# Patient Record
Sex: Male | Born: 1945 | Hispanic: Yes | Marital: Married | Smoking: Former smoker
Health system: Southern US, Community
[De-identification: ages and names within clinical notes are randomized; demographics above are authoritative.]

## PROBLEM LIST (undated history)

## (undated) DIAGNOSIS — B019 Varicella without complication: Secondary | ICD-10-CM

## (undated) DIAGNOSIS — I639 Cerebral infarction, unspecified: Secondary | ICD-10-CM

## (undated) DIAGNOSIS — I10 Essential (primary) hypertension: Secondary | ICD-10-CM

## (undated) DIAGNOSIS — J45909 Unspecified asthma, uncomplicated: Secondary | ICD-10-CM

## (undated) DIAGNOSIS — N189 Chronic kidney disease, unspecified: Secondary | ICD-10-CM

## (undated) DIAGNOSIS — T7840XA Allergy, unspecified, initial encounter: Secondary | ICD-10-CM

## (undated) HISTORY — DX: Cerebral infarction, unspecified: I63.9

## (undated) HISTORY — DX: Varicella without complication: B01.9

## (undated) HISTORY — DX: Allergy, unspecified, initial encounter: T78.40XA

## (undated) HISTORY — PX: CHOLECYSTECTOMY: SHX55

## (undated) HISTORY — DX: Chronic kidney disease, unspecified: N18.9

## (undated) HISTORY — DX: Unspecified asthma, uncomplicated: J45.909

## (undated) HISTORY — DX: Essential (primary) hypertension: I10

---

## 2010-12-06 ENCOUNTER — Encounter: Payer: Self-pay | Admitting: Family Medicine

## 2010-12-06 ENCOUNTER — Ambulatory Visit (INDEPENDENT_AMBULATORY_CARE_PROVIDER_SITE_OTHER): Payer: Medicare HMO | Admitting: Family Medicine

## 2010-12-06 VITALS — BP 130/70 | HR 72 | Temp 98.2°F | Resp 12 | Ht 68.75 in | Wt 176.0 lb

## 2010-12-06 DIAGNOSIS — J309 Allergic rhinitis, unspecified: Secondary | ICD-10-CM

## 2010-12-06 DIAGNOSIS — R05 Cough: Secondary | ICD-10-CM

## 2010-12-06 DIAGNOSIS — I10 Essential (primary) hypertension: Secondary | ICD-10-CM

## 2010-12-06 DIAGNOSIS — N200 Calculus of kidney: Secondary | ICD-10-CM

## 2010-12-06 MED ORDER — MONTELUKAST SODIUM 10 MG PO TABS: 10.0000 mg | ORAL_TABLET | Freq: Every day | ORAL | Status: DC

## 2010-12-06 MED ORDER — VALSARTAN-HYDROCHLOROTHIAZIDE 160-25 MG PO TABS: 1.0000 | ORAL_TABLET | Freq: Every day | ORAL | Status: DC

## 2010-12-06 NOTE — Patient Instructions (Signed)
Schedule complete physical yearly.

## 2010-12-06 NOTE — Progress Notes (Signed)
  Subjective:    Patient ID: Dustin Nielsen, male    DOB: 01-27-1946, 65 y.o.   MRN: [id number]  HPI Patient is seen new to establish care. Past medical history reviewed. Recently moved here from [place]. His children live here. He is married. Past medical history significant for hypertension, history of kidney stones, and seasonal and perennial allergies. Medications reviewed. Takes Diovan HCTZ for hypertension and needs refills. Blood pressure is well controlled. Walks some for exercise. Weight stable. Denies any headaches, dizziness, chest pain.  Frequent nasal congestion symptoms. Tried Claritin and Zyrtec without much relief. He has nighttime cough and wheezing. No history of asthma. Nonsmoker. He denies any GERD symptoms.  The patient is married. Smoked only briefly in his 54s and early 32s. No alcohol use. He is retired.  Family history is that of father having stroke and hypertension. Mother died age 21 of some type of kidney disease.   Review of Systems  Constitutional: Negative for fever, chills, appetite change, fatigue and unexpected weight change.  HENT: Positive for congestion, rhinorrhea and postnasal drip. Negative for neck pain.   Respiratory: Positive for cough and wheezing. Negative for shortness of breath.   Cardiovascular: Negative for chest pain, palpitations and leg swelling.  Gastrointestinal: Negative for abdominal pain.  Genitourinary: Negative for dysuria.  Musculoskeletal: Negative for back pain.  Neurological: Negative for dizziness, syncope and headaches.  Hematological: Negative for adenopathy.  Psychiatric/Behavioral: Negative for dysphoric mood.       Objective:   Physical Exam  Constitutional: He is oriented to person, place, and time. He appears well-developed and well-nourished. No distress.  HENT:  Head: Normocephalic and atraumatic.  Right Ear: External ear normal.  Left Ear: External ear normal.  Mouth/Throat: Oropharynx is clear and moist. No  oropharyngeal exudate.  Neck: Neck supple. No thyromegaly present.  Cardiovascular: Normal rate, regular rhythm and normal heart sounds.   Pulmonary/Chest: Effort normal and breath sounds normal. No respiratory distress. He has no wheezes. He has no rales.  Musculoskeletal: He exhibits no edema.  Lymphadenopathy:    He has no cervical adenopathy.  Neurological: He is alert and oriented to person, place, and time.  Psychiatric: He has a normal mood and affect.          Assessment & Plan:  #1 hypertension stable refill medication for one year #2 history of kidney stones. No recent symptoms #3 history of seasonal/ perennial allergies.  Associated nighttime cough which may be allergy related. Question mild component of reactive airway disease though no wheezing noted this time.   Start Singulair 10 mg daily and touch base within one month if no better #4 health maintenance issues addressed. Colonoscopy 2011. [facility] records. Schedule complete physical later this year

## 2010-12-07 DIAGNOSIS — N200 Calculus of kidney: Secondary | ICD-10-CM | POA: Insufficient documentation

## 2010-12-07 DIAGNOSIS — J309 Allergic rhinitis, unspecified: Secondary | ICD-10-CM | POA: Insufficient documentation

## 2010-12-18 ENCOUNTER — Ambulatory Visit (INDEPENDENT_AMBULATORY_CARE_PROVIDER_SITE_OTHER): Payer: Medicare HMO | Admitting: Family Medicine

## 2010-12-18 DIAGNOSIS — Z23 Encounter for immunization: Secondary | ICD-10-CM

## 2011-07-01 ENCOUNTER — Telehealth: Payer: Self-pay | Admitting: Family Medicine

## 2011-07-01 NOTE — Telephone Encounter (Signed)
Pt has appt 07/02/11 at 2:45pm.

## 2011-07-01 NOTE — Telephone Encounter (Signed)
Pt is experiencing symptoms of a cold and is having a bad cough and would like to be seen by Dr. Caryl Never. Please contact

## 2011-07-02 ENCOUNTER — Ambulatory Visit (INDEPENDENT_AMBULATORY_CARE_PROVIDER_SITE_OTHER): Payer: Medicare HMO | Admitting: Family Medicine

## 2011-07-02 ENCOUNTER — Encounter: Payer: Self-pay | Admitting: Family Medicine

## 2011-07-02 VITALS — BP 144/78 | Temp 98.0°F | Wt 179.0 lb

## 2011-07-02 DIAGNOSIS — J45909 Unspecified asthma, uncomplicated: Secondary | ICD-10-CM

## 2011-07-02 MED ORDER — AZITHROMYCIN 250 MG PO TABS: ORAL_TABLET | ORAL | Status: AC

## 2011-07-02 MED ORDER — METHYLPREDNISOLONE ACETATE 80 MG/ML IJ SUSP
80.0000 mg | Freq: Once | INTRAMUSCULAR | Status: AC
  Administered 2011-07-02: 80 mg via INTRAMUSCULAR

## 2011-07-02 NOTE — Patient Instructions (Signed)

## 2011-07-02 NOTE — Progress Notes (Signed)
  Subjective:    Patient ID: Dustin Nielsen, male    DOB: January 08, 1946, 65 y.o.   MRN: [id number]  HPI  Acute visit. Patient is seen with 2 history of cough. He had some intermittent wheezing especially at night. No fevers or chills. Quit smoking almost 35 years ago. No history of asthma. Cough productive of yellow sputum. Some ongoing nasal congestion. No release with nasal irrigation. Denies any appetite or weight changes. No hemoptysis. No pleuritic pain.   Review of Systems  Constitutional: Negative for fever, chills and fatigue.  HENT: Negative for ear pain and sore throat.   Respiratory: Positive for cough, shortness of breath (at night only) and wheezing.   Cardiovascular: Negative for chest pain.       Objective:   Physical Exam  Constitutional: He appears well-developed and well-nourished.  HENT:  Right Ear: External ear normal.  Left Ear: External ear normal.  Mouth/Throat: Oropharynx is clear and moist.  Neck: Neck supple.  Cardiovascular: Normal rate and regular rhythm.   Pulmonary/Chest:       Patient some diffuse expiratory wheezes. No rales.          Assessment & Plan:  Asthmatic bronchitis. Zithromax for 5 days. Depo-Medrol 80 mg IM.  Touch base one week if no improvement and sooner as needed

## 2011-07-28 ENCOUNTER — Telehealth: Payer: Self-pay | Admitting: Family Medicine

## 2011-07-28 ENCOUNTER — Encounter: Payer: Self-pay | Admitting: Family

## 2011-07-28 ENCOUNTER — Ambulatory Visit (INDEPENDENT_AMBULATORY_CARE_PROVIDER_SITE_OTHER): Payer: Medicare HMO | Admitting: Family

## 2011-07-28 VITALS — BP 138/90 | Temp 98.1°F | Wt 174.0 lb

## 2011-07-28 DIAGNOSIS — R05 Cough: Secondary | ICD-10-CM

## 2011-07-28 DIAGNOSIS — J209 Acute bronchitis, unspecified: Secondary | ICD-10-CM

## 2011-07-28 MED ORDER — ALBUTEROL SULFATE HFA 108 (90 BASE) MCG/ACT IN AERS: 2.0000 | INHALATION_SPRAY | Freq: Four times a day (QID) | RESPIRATORY_TRACT | Status: DC | PRN

## 2011-07-28 MED ORDER — PREDNISONE 20 MG PO TABS: ORAL_TABLET | ORAL | Status: AC

## 2011-07-28 MED ORDER — ALBUTEROL SULFATE (2.5 MG/3ML) 0.083% IN NEBU: 2.5000 mg | INHALATION_SOLUTION | Freq: Four times a day (QID) | RESPIRATORY_TRACT | Status: DC | PRN

## 2011-07-28 NOTE — Telephone Encounter (Signed)
Returned pt call. According to OV note, nebulizer solution was ordered instead of inhaler.  Rx for inhaler sent to pt's pharmacy and pt aware.

## 2011-07-28 NOTE — Progress Notes (Signed)
Subjective:    Patient ID: Dustin Nielsen, male    DOB: 1945-10-10, 66 y.o.   MRN: [id number]  HPI 66 year old male, former smoker, a patient of Dr. Caryl Never is in today with complaint of cough and wheezing and one on since December 12. He was prescribed a Z-Pak at that time, Depo-Medrol IM in the office, and Singulair. He has completed that therapy and his symptoms have not resolved. Denies any fever, myalgias, or chest pain.   Review of Systems  Constitutional: Negative.   HENT: Negative.   Eyes: Negative.   Respiratory: Positive for cough and wheezing.   Cardiovascular: Negative.   Musculoskeletal: Negative.   Skin: Negative.   Neurological: Negative.   Hematological: Negative.   Psychiatric/Behavioral: Negative.    Past Medical History  Diagnosis Date  . Chicken pox   . Allergy   . Hypertension   . Chronic kidney disease     stones    History   Social History  . Marital Status: Married    Spouse Name: N/A    Number of Children: N/A  . Years of Education: N/A   Occupational History  . Not on file.   Social History Main Topics  . Smoking status: Former Smoker -- 1.0 packs/day for 15 years    Types: Cigarettes    Quit date: 12/05/1973  . Smokeless tobacco: Not on file  . Alcohol Use: Not on file  . Drug Use: Not on file  . Sexually Active: Not on file   Other Topics Concern  . Not on file   Social History Narrative  . No narrative on file    Past Surgical History  Procedure Date  . Cholecystectomy     Family History  Problem Relation Age of Onset  . Heart disease Mother   . Kidney disease Mother 70    deceased  . Stroke Father   . Hypertension Father     No Known Allergies  Current Outpatient Prescriptions on File Prior to Visit  Medication Sig Dispense Refill  . Ascorbic Acid (VITAMIN C) 1000 MG tablet Take 1,000 mg by mouth daily.        . montelukast (SINGULAIR) 10 MG tablet Take 1 tablet (10 mg total) by mouth at bedtime.  30 tablet  11   . Multiple Vitamin (MULTI VITAMIN MENS PO) Take by mouth daily.        . valsartan-hydrochlorothiazide (DIOVAN-HCT) 160-25 MG per tablet Take 1 tablet by mouth daily.  90 tablet  3    BP 138/90  Temp(Src) 98.1 F (36.7 C) (Oral)  Wt 174 lb (78.926 kg)chart   Objective:   Physical Exam  Constitutional: He is oriented to person, place, and time. He appears well-developed and well-nourished.  HENT:  Right Ear: External ear normal.  Left Ear: External ear normal.  Nose: Nose normal.  Mouth/Throat: Oropharynx is clear and moist.  Eyes: Conjunctivae are normal.  Neck: Normal range of motion. Neck supple.  Cardiovascular: Normal rate, regular rhythm and normal heart sounds.   Pulmonary/Chest: Effort normal. No respiratory distress. He has wheezes. He has no rales.       Wheezing noted bilaterally. Good air movement. No respiratory distress.  Musculoskeletal: Normal range of motion.  Neurological: He is alert and oriented to person, place, and time.  Skin: Skin is warm and dry.  Psychiatric: He has a normal mood and affect.          Assessment & Plan:  Assessment: Bronchitis, cough  Plan: Prednisone  60x3, 40x3, 20x3. Proventil HFA 2 puffs every 6 hours when necessary. Nebulizer treatment given in the office in. Patient to call if symptoms worsen or persist, recheck as scheduled and when necessary.

## 2011-07-28 NOTE — Telephone Encounter (Signed)
Was seen today and has a question about a rx was called in today. Thanks.

## 2011-07-28 NOTE — Telephone Encounter (Signed)
Pt called again. Thanks!

## 2011-07-28 NOTE — Patient Instructions (Signed)

## 2011-09-04 ENCOUNTER — Ambulatory Visit (INDEPENDENT_AMBULATORY_CARE_PROVIDER_SITE_OTHER): Payer: Medicare HMO | Admitting: Family Medicine

## 2011-09-04 ENCOUNTER — Encounter: Payer: Self-pay | Admitting: Family Medicine

## 2011-09-04 DIAGNOSIS — J45909 Unspecified asthma, uncomplicated: Secondary | ICD-10-CM

## 2011-09-04 NOTE — Progress Notes (Signed)
  Subjective:    Patient ID: Dustin Nielsen, male    DOB: [birth year], 66 y.o.   MRN: [id number]  HPI  Patient is seen with persistent cough and wheezing now for a few months. He has questionable history of asthma has been seen twice since December with cough and wheeze. Each time he was treated with prednisone he had some brief mild improvement. He has been taking Singulair without relief. He gets temporary relief with Ventolin for about 6 hours. He does not recall being on anti-inflammatory medications previously. He quit smoking back in 1975. Possible triggers for wheezing include cold air exposure. Symptoms seem worse at night and have been almost daily. He has not had any appetite change or weight change. No fevers or chills. No hemoptysis. No chronic sinusitis symptoms  Past Medical History  Diagnosis Date  . Chicken pox   . Allergy   . Hypertension   . Chronic kidney disease     stones   Past Surgical History  Procedure Date  . Cholecystectomy     reports that he quit smoking about 37 years ago. His smoking use included Cigarettes. He has a 15 pack-year smoking history. He does not have any smokeless tobacco history on file. His alcohol and drug histories not on file. family history includes Heart disease in his mother; Hypertension in his father; Kidney disease (age of onset:43) in his mother; and Stroke in his father. No Known Allergies    Review of Systems  Constitutional: Negative for fever, chills, appetite change and unexpected weight change.  HENT: Negative for congestion, voice change and postnasal drip.   Respiratory: Positive for cough, shortness of breath and wheezing.   Cardiovascular: Negative for chest pain, palpitations and leg swelling.  Neurological: Negative for dizziness and syncope.  Hematological: Negative for adenopathy.       Objective:   Physical Exam  Constitutional: He appears well-developed and well-nourished.  HENT:  Mouth/Throat: Oropharynx is  clear and moist.  Neck: Neck supple. No thyromegaly present.  Cardiovascular: Normal rate and regular rhythm.   Pulmonary/Chest: Effort normal.       Patient has some diffuse expiratory wheezes. No rales.  Lymphadenopathy:    He has no cervical adenopathy.          Assessment & Plan:  Persistent cough/wheezing. Suspect moderate persistent asthma. Check spirometry. Consider anti-inflammatory medication such as Advair.  Will start Advair 250 mg one puff Bid.  He has almost daily cough and wheeze.  Samples given and reassess one month.

## 2011-09-04 NOTE — Patient Instructions (Signed)
Start Advair 1 puff twice daily. Rinse mouth after use. Stop Singulair

## 2011-10-02 ENCOUNTER — Encounter: Payer: Self-pay | Admitting: Family Medicine

## 2011-10-02 ENCOUNTER — Ambulatory Visit (INDEPENDENT_AMBULATORY_CARE_PROVIDER_SITE_OTHER): Payer: Medicare HMO | Admitting: Family Medicine

## 2011-10-02 DIAGNOSIS — J45909 Unspecified asthma, uncomplicated: Secondary | ICD-10-CM

## 2011-10-02 MED ORDER — FLUTICASONE-SALMETEROL 100-50 MCG/DOSE IN AEPB: 1.0000 | INHALATION_SPRAY | Freq: Two times a day (BID) | RESPIRATORY_TRACT | Status: DC

## 2011-10-02 NOTE — Progress Notes (Signed)
  Subjective:    Patient ID: Dustin Nielsen, male    DOB: 1946-02-09, 66 y.o.   MRN: [id number]  HPI  Patient seen for followup asthma. He had several months of cough and wheezing especially at night. We started Advair and he has seen tremendous improvement. His cough is essentially resolved. Increased exercise capacity. No wheezing after 2 weeks of use of Advair. Occasional hoarseness. No thrush. Denies any dyspnea. No side effects to medication.   Review of Systems  Constitutional: Negative for fever, chills and appetite change.  Respiratory: Negative for cough, shortness of breath and wheezing.   Cardiovascular: Negative for chest pain.  Neurological: Negative for dizziness.  Hematological: Negative for adenopathy.       Objective:   Physical Exam  Constitutional: He appears well-developed and well-nourished.  HENT:  Mouth/Throat: Oropharynx is clear and moist.  Cardiovascular: Normal rate and regular rhythm.   No murmur heard. Pulmonary/Chest: Effort normal and breath sounds normal. No respiratory distress. He has no wheezes. He has no rales.  Musculoskeletal: He exhibits no edema.          Assessment & Plan:  Asthma greatly improved. Continue Advair with prescription for 100/50 mg one puff twice daily and remind her to rinse after use. Routine followup 6 months. Flu vaccine then

## 2011-10-02 NOTE — Patient Instructions (Signed)

## 2011-12-02 ENCOUNTER — Other Ambulatory Visit: Payer: Self-pay | Admitting: Family Medicine

## 2011-12-30 ENCOUNTER — Other Ambulatory Visit: Payer: Self-pay | Admitting: *Deleted

## 2011-12-30 DIAGNOSIS — Z0000 Encounter for general adult medical examination without abnormal findings: Secondary | ICD-10-CM

## 2011-12-31 ENCOUNTER — Other Ambulatory Visit (INDEPENDENT_AMBULATORY_CARE_PROVIDER_SITE_OTHER): Payer: Medicare HMO

## 2011-12-31 DIAGNOSIS — I10 Essential (primary) hypertension: Secondary | ICD-10-CM

## 2011-12-31 DIAGNOSIS — Z125 Encounter for screening for malignant neoplasm of prostate: Secondary | ICD-10-CM

## 2011-12-31 DIAGNOSIS — Z0000 Encounter for general adult medical examination without abnormal findings: Secondary | ICD-10-CM

## 2011-12-31 LAB — HEPATIC FUNCTION PANEL
ALT: 29 U/L (ref 0–53)
AST: 23 U/L (ref 0–37)
Alkaline Phosphatase: 64 U/L (ref 39–117)
Bilirubin, Direct: 0.1 mg/dL (ref 0.0–0.3)
Total Protein: 6.7 g/dL (ref 6.0–8.3)

## 2011-12-31 LAB — POCT URINALYSIS DIPSTICK
Nitrite, UA: NEGATIVE
Protein, UA: NEGATIVE
Spec Grav, UA: 1.015
Urobilinogen, UA: 0.2

## 2011-12-31 LAB — BASIC METABOLIC PANEL
Calcium: 9.5 mg/dL (ref 8.4–10.5)
Creatinine, Ser: 1.1 mg/dL (ref 0.4–1.5)
GFR: 73.77 mL/min (ref >60.00)
Sodium: 141 mEq/L (ref 135–145)

## 2011-12-31 LAB — CBC WITH DIFFERENTIAL/PLATELET
Basophils Relative: 1 % (ref 0.0–3.0)
Eosinophils Absolute: 0.3 10*3/uL (ref 0.0–0.7)
Eosinophils Relative: 6.3 % — ABNORMAL HIGH (ref 0.0–5.0)
Hemoglobin: 15 g/dL (ref 13.0–17.0)
Lymphocytes Relative: 39.8 % (ref 12.0–46.0)
Monocytes Relative: 6.3 % (ref 3.0–12.0)
Neutro Abs: 2.4 10*3/uL (ref 1.4–7.7)
Neutrophils Relative %: 46.6 % (ref 43.0–77.0)
RBC: 4.76 Mil/uL (ref 4.22–5.81)
WBC: 5.1 10*3/uL (ref 4.5–10.5)

## 2011-12-31 LAB — PSA: PSA: 1.43 ng/mL (ref 0.10–4.00)

## 2011-12-31 LAB — LIPID PANEL
LDL Cholesterol: 108 mg/dL — ABNORMAL HIGH (ref 0–99)
Total CHOL/HDL Ratio: 4
Triglycerides: 142 mg/dL (ref 0.0–149.0)

## 2012-01-05 ENCOUNTER — Encounter: Payer: Medicare HMO | Admitting: Family Medicine

## 2012-01-23 ENCOUNTER — Encounter: Payer: Medicare HMO | Admitting: Family Medicine

## 2012-02-05 ENCOUNTER — Encounter: Payer: Self-pay | Admitting: Family Medicine

## 2012-02-05 ENCOUNTER — Ambulatory Visit (INDEPENDENT_AMBULATORY_CARE_PROVIDER_SITE_OTHER): Payer: Medicare HMO | Admitting: Family Medicine

## 2012-02-05 VITALS — BP 150/80 | HR 72 | Temp 98.3°F | Resp 12 | Ht 68.75 in | Wt 180.0 lb

## 2012-02-05 DIAGNOSIS — Z0000 Encounter for general adult medical examination without abnormal findings: Secondary | ICD-10-CM

## 2012-02-05 DIAGNOSIS — I10 Essential (primary) hypertension: Secondary | ICD-10-CM

## 2012-02-05 DIAGNOSIS — J45909 Unspecified asthma, uncomplicated: Secondary | ICD-10-CM

## 2012-02-05 DIAGNOSIS — D696 Thrombocytopenia, unspecified: Secondary | ICD-10-CM

## 2012-02-05 NOTE — Patient Instructions (Addendum)
Return in one month for repeat CBC. Let me know if you change your mind regarding Pneumovax and Shingles vaccine.

## 2012-02-05 NOTE — Progress Notes (Signed)
Subjective:    Patient ID: Dustin Nielsen, male    DOB: 12/22/1945, 66 y.o.   MRN: [id number]  HPI  Patient seen for medical followup and Medicare wellness exam. He exercises regularly at least 5 days per week. Colonoscopy 2011. No history of Pneumovax or shingles vaccine and he refuses both today. Tetanus is up-to-date.  Patient has hypertension treated with Diovan HCTZ. Compliant with therapy. Blood pressures consistently running about 140/80. No history of diabetes. Asthma is intermittent and usually seasonal. Currently not taking any regular inhalers.   Past medical history, social history, and family history reviewed and as indicated below  Past Medical History  Diagnosis Date  . Chicken pox   . Allergy   . Hypertension   . Chronic kidney disease     stones   Past Surgical History  Procedure Date  . Cholecystectomy     reports that he quit smoking about 38 years ago. His smoking use included Cigarettes. He has a 15 pack-year smoking history. He does not have any smokeless tobacco history on file. His alcohol and drug histories not on file. family history includes Heart disease in his mother; Hypertension in his father; Kidney disease (age of onset:43) in his mother; and Stroke in his father. No Known Allergies  1.  Risk factors based on Past Medical , Social, and Family history reviewed as indicated above 2.  Limitations in physical activities very low risk for falls. Exercises regularly 3.  Depression/mood no depression or anxiety issues 4.  Hearing intact with no deficits 5.  ADLs fully independent in all 6.  Cognitive function (orientation to time and place, language, writing, speech,memory) no memory deficits. No language or judgment defects 7.  Home Safety no issues identified 8.  Height, weight, and visual acuity. Height, weight, and visual acuity all stable. 9.  Counseling discussed diet and exercise. 10. Recommendation of preventive services. Recommendation for  Pneumovax and shingles vaccine and patient declines both. Also recommended yearly flu vaccine which he declines. 11. Labs based on risk factors recent labs reviewed with patient and all basically favorable. Does have mildly reduced platelet count of uncertain significance. He is not recall prior history of thrombocytopenia 12. Care Plan repeat CBC in one month. Continue close monitoring of blood pressure.    Review of Systems  Constitutional: Negative for fever, activity change, appetite change and fatigue.  HENT: Negative for ear pain, congestion and trouble swallowing.   Eyes: Negative for pain and visual disturbance.  Respiratory: Negative for cough, shortness of breath and wheezing.   Cardiovascular: Negative for chest pain and palpitations.  Gastrointestinal: Negative for nausea, vomiting, abdominal pain, diarrhea, constipation, blood in stool, abdominal distention and rectal pain.  Genitourinary: Negative for dysuria, hematuria and testicular pain.  Musculoskeletal: Negative for joint swelling and arthralgias.  Skin: Negative for rash.  Neurological: Negative for dizziness, syncope and headaches.  Hematological: Negative for adenopathy.  Psychiatric/Behavioral: Negative for confusion and dysphoric mood.       Objective:   Physical Exam  Constitutional: He is oriented to person, place, and time. He appears well-developed and well-nourished. No distress.  HENT:  Head: Normocephalic and atraumatic.  Right Ear: External ear normal.  Left Ear: External ear normal.  Mouth/Throat: Oropharynx is clear and moist.  Eyes: Conjunctivae and EOM are normal. Pupils are equal, round, and reactive to light.  Neck: Normal range of motion. Neck supple. No thyromegaly present.  Cardiovascular: Normal rate, regular rhythm and normal heart sounds.   No murmur heard.  Pulmonary/Chest: No respiratory distress. He has no wheezes. He has no rales.  Abdominal: Soft. Bowel sounds are normal. He exhibits  no distension and no mass. There is no tenderness. There is no rebound and no guarding.  Genitourinary: Rectum normal and prostate normal.  Musculoskeletal: He exhibits no edema.  Lymphadenopathy:    He has no cervical adenopathy.  Neurological: He is alert and oriented to person, place, and time. He displays normal reflexes. No cranial nerve deficit.  Skin: No rash noted.  Psychiatric: He has a normal mood and affect.          Assessment & Plan:  #1 health maintenance. Recommend Pneumovax and shingles vaccine he declines both. Tetanus up-to-date. Colonoscopy up to date. PSA normal.  #2 hypertension. Continue current medication. Continue close monitoring. Be in touch if systolic blood pressure rising above 140. Continue regular aerobic exercise.  #3 history of asthma. Does have seasonal variation. Recommend getting back on Advair if he has more frequent wheezing. We've strongly advocated flu vaccine and Pneumovax and he declines both #4 mild thrombocytopenia. Question chronic. No prior values to compare with. Recheck CBC 1 month to make sure this is not dropping any

## 2012-03-09 ENCOUNTER — Other Ambulatory Visit (INDEPENDENT_AMBULATORY_CARE_PROVIDER_SITE_OTHER): Payer: Medicare HMO

## 2012-03-09 DIAGNOSIS — D696 Thrombocytopenia, unspecified: Secondary | ICD-10-CM

## 2012-03-09 LAB — CBC WITH DIFFERENTIAL/PLATELET
Eosinophils Absolute: 0.3 10*3/uL (ref 0.0–0.7)
Eosinophils Relative: 6.7 % — ABNORMAL HIGH (ref 0.0–5.0)
MCHC: 33.6 g/dL (ref 30.0–36.0)
MCV: 93.8 fl (ref 78.0–100.0)
Monocytes Absolute: 0.3 10*3/uL (ref 0.1–1.0)
Neutrophils Relative %: 51.4 % (ref 43.0–77.0)
Platelets: 163 10*3/uL (ref 150.0–400.0)
WBC: 4.8 10*3/uL (ref 4.5–10.5)

## 2012-03-10 NOTE — Progress Notes (Signed)
Quick Note:  Attempted to call pt at home number and it states "the number has been changed" ______

## 2012-03-17 NOTE — Progress Notes (Signed)
Quick Note:  Labs mailed to pt home as home phone "has been changed". ______

## 2012-10-31 ENCOUNTER — Other Ambulatory Visit: Payer: Self-pay | Admitting: Family Medicine

## 2012-11-01 ENCOUNTER — Other Ambulatory Visit: Payer: Self-pay | Admitting: *Deleted

## 2012-11-01 MED ORDER — VALSARTAN-HYDROCHLOROTHIAZIDE 160-25 MG PO TABS: ORAL_TABLET | ORAL | Status: DC

## 2012-11-02 ENCOUNTER — Other Ambulatory Visit: Payer: Self-pay | Admitting: *Deleted

## 2012-11-02 MED ORDER — VALSARTAN-HYDROCHLOROTHIAZIDE 160-25 MG PO TABS: ORAL_TABLET | ORAL | Status: DC

## 2013-04-05 ENCOUNTER — Telehealth: Payer: Self-pay | Admitting: Family Medicine

## 2013-04-05 NOTE — Telephone Encounter (Signed)
Is there any way you can fit them in anywhere before they have to leave

## 2013-04-05 NOTE — Telephone Encounter (Signed)
[place] has been out of country for the past six months. Py is leaving again first week of oct for 3-4 months. Pt would like to come in for cpe before they leave again. (he and wife both want to come in) Pls advise

## 2013-04-06 NOTE — Telephone Encounter (Signed)
done

## 2013-04-13 ENCOUNTER — Ambulatory Visit (INDEPENDENT_AMBULATORY_CARE_PROVIDER_SITE_OTHER): Payer: Medicare HMO | Admitting: Family Medicine

## 2013-04-13 ENCOUNTER — Encounter: Payer: Self-pay | Admitting: Family Medicine

## 2013-04-13 VITALS — BP 142/70 | HR 61 | Temp 98.2°F | Ht 68.0 in | Wt 180.0 lb

## 2013-04-13 DIAGNOSIS — I10 Essential (primary) hypertension: Secondary | ICD-10-CM

## 2013-04-13 DIAGNOSIS — Z0000 Encounter for general adult medical examination without abnormal findings: Secondary | ICD-10-CM

## 2013-04-13 DIAGNOSIS — J45909 Unspecified asthma, uncomplicated: Secondary | ICD-10-CM

## 2013-04-13 DIAGNOSIS — Z125 Encounter for screening for malignant neoplasm of prostate: Secondary | ICD-10-CM

## 2013-04-13 LAB — CBC WITH DIFFERENTIAL/PLATELET
Basophils Absolute: 0 10*3/uL (ref 0.0–0.1)
Basophils Relative: 0.8 % (ref 0.0–3.0)
Hemoglobin: 15.1 g/dL (ref 13.0–17.0)
Lymphocytes Relative: 32.6 % (ref 12.0–46.0)
Monocytes Relative: 5.7 % (ref 3.0–12.0)
Neutro Abs: 3.5 10*3/uL (ref 1.4–7.7)
RBC: 4.76 Mil/uL (ref 4.22–5.81)

## 2013-04-13 LAB — BASIC METABOLIC PANEL
BUN: 14 mg/dL (ref 6–23)
Chloride: 101 mEq/L (ref 96–112)
Glucose, Bld: 87 mg/dL (ref 70–99)
Potassium: 3.8 mEq/L (ref 3.5–5.1)

## 2013-04-13 LAB — HEPATIC FUNCTION PANEL
Alkaline Phosphatase: 62 U/L (ref 39–117)
Bilirubin, Direct: 0.1 mg/dL (ref 0.0–0.3)
Total Protein: 7.3 g/dL (ref 6.0–8.3)

## 2013-04-13 LAB — LIPID PANEL
Total CHOL/HDL Ratio: 4
VLDL: 42.8 mg/dL — ABNORMAL HIGH (ref 0.0–40.0)

## 2013-04-13 NOTE — Progress Notes (Signed)
Subjective:    Patient ID: Dustin Nielsen, male    DOB: [birth year], 67 y.o.   MRN: [id number]  HPI Patient here for Medicare wellness exam and medical followup Chronic problems include history of hypertension and mild intermittent asthma. He's had prior history of kidney stones. He remains on valsartan HCTZ and blood pressures been well controlled. No headaches or dizziness. No recent chest pains. Exercises regularly with swimming.  Colonoscopy 2011. Tetanus 2012. He refuses flu vaccine and Pneumovax.  Past Medical History  Diagnosis Date  . Chicken pox   . Allergy   . Hypertension   . Chronic kidney disease     stones  . Asthma    Past Surgical History  Procedure Laterality Date  . Cholecystectomy      reports that he quit smoking about 39 years ago. His smoking use included Cigarettes. He has a 15 pack-year smoking history. He does not have any smokeless tobacco history on file. His alcohol and drug histories are not on file. family history includes Heart disease in his mother; Hypertension in his father; Kidney disease (age of onset: 48) in his mother; Stroke in his father. No Known Allergies  1.  Risk factors based on Past Medical , Social, and Family history reviewed and as indicated above 2.  Limitations in physical activities stays reactive. Low risk for fall 3.  Depression/mood no depression or anxiety issues 4.  Hearing intact 5.  ADLs independent in all 6.  Cognitive function (orientation to time and place, language, writing, speech,memory) no memory deficits. Language and judgment intact 7.  Home Safety no issues 8.  Height, weight, and visual acuity. All stable 9.  Counseling discussed the importance of weight control and continue regular exercise. 10. Recommendation of preventive services. We recommended pneumonia vaccine and flu vaccine and he declines both. We also discussed shingles vaccine he declined this as well 11. Labs based on risk factors basic metabolic  panel, lipid panel, CBC, TSH, PSA. We discussed risk and benefits of PSA and he prefers to continue 12. Care Plan as above    Review of Systems  Constitutional: Negative for fever, activity change, appetite change and fatigue.  HENT: Negative for ear pain, congestion and trouble swallowing.   Eyes: Negative for pain and visual disturbance.  Respiratory: Negative for cough, shortness of breath and wheezing.   Cardiovascular: Negative for chest pain and palpitations.  Gastrointestinal: Negative for nausea, vomiting, abdominal pain, diarrhea, constipation, blood in stool, abdominal distention and rectal pain.  Endocrine: Negative for polydipsia and polyuria.  Genitourinary: Negative for dysuria, hematuria, decreased urine volume and testicular pain.  Musculoskeletal: Negative for joint swelling and arthralgias.  Skin: Negative for rash.  Neurological: Negative for dizziness, syncope and headaches.  Hematological: Negative for adenopathy.  Psychiatric/Behavioral: Negative for confusion and dysphoric mood.       Objective:   Physical Exam  Constitutional: He is oriented to person, place, and time. He appears well-developed and well-nourished. No distress.  HENT:  Head: Normocephalic and atraumatic.  Right Ear: External ear normal.  Left Ear: External ear normal.  Mouth/Throat: Oropharynx is clear and moist.  Eyes: Conjunctivae and EOM are normal. Pupils are equal, round, and reactive to light.  Neck: Normal range of motion. Neck supple. No thyromegaly present.  Cardiovascular: Normal rate, regular rhythm and normal heart sounds.   No murmur heard. Pulmonary/Chest: No respiratory distress. He has no wheezes. He has no rales.  Abdominal: Soft. Bowel sounds are normal. He exhibits no distension and  no mass. There is no tenderness. There is no rebound and no guarding.  Musculoskeletal: He exhibits no edema.  Lymphadenopathy:    He has no cervical adenopathy.  Neurological: He is alert  and oriented to person, place, and time. He displays normal reflexes. No cranial nerve deficit.  Skin: No rash noted.  Psychiatric: He has a normal mood and affect.          Assessment & Plan:  #1 health maintenance. Flu vaccine, Pneumovax, and shingles vaccine and recommended he declines all 3. Discussed healthy lifestyle habits. Obtain screening lab work as above #2 hypertension. Adequate control. Continue current medication #3 history of intermittent asthma. Currently stable off medications

## 2013-04-13 NOTE — Patient Instructions (Addendum)
You should consider flu vaccine and pneumonia vaccines.  Let up know if you change your mind.

## 2013-04-15 ENCOUNTER — Encounter: Payer: Self-pay | Admitting: Family Medicine

## 2013-05-14 ENCOUNTER — Other Ambulatory Visit: Payer: Self-pay | Admitting: Family Medicine

## 2013-12-03 ENCOUNTER — Other Ambulatory Visit: Payer: Self-pay | Admitting: Family Medicine

## 2014-07-26 ENCOUNTER — Other Ambulatory Visit (INDEPENDENT_AMBULATORY_CARE_PROVIDER_SITE_OTHER): Payer: Commercial Managed Care - HMO

## 2014-07-26 DIAGNOSIS — Z0000 Encounter for general adult medical examination without abnormal findings: Secondary | ICD-10-CM | POA: Diagnosis not present

## 2014-07-26 LAB — POCT URINALYSIS DIPSTICK
Bilirubin, UA: NEGATIVE
Blood, UA: NEGATIVE
GLUCOSE UA: NEGATIVE
KETONES UA: NEGATIVE
Leukocytes, UA: NEGATIVE
Nitrite, UA: NEGATIVE
PROTEIN UA: NEGATIVE
SPEC GRAV UA: 1.015
Urobilinogen, UA: 0.2
pH, UA: 6

## 2014-07-26 LAB — COMPREHENSIVE METABOLIC PANEL
ALBUMIN: 4.1 g/dL (ref 3.5–5.2)
ALK PHOS: 64 U/L (ref 39–117)
ALT: 28 U/L (ref 0–53)
AST: 25 U/L (ref 0–37)
BUN: 16 mg/dL (ref 6–23)
CALCIUM: 9.3 mg/dL (ref 8.4–10.5)
CO2: 29 mEq/L (ref 19–32)
Chloride: 102 mEq/L (ref 96–112)
Creatinine, Ser: 1 mg/dL (ref 0.4–1.5)
GFR: 74.85 mL/min (ref >60.00)
Glucose, Bld: 91 mg/dL (ref 70–99)
Potassium: 3.6 mEq/L (ref 3.5–5.1)
Sodium: 139 mEq/L (ref 135–145)
TOTAL PROTEIN: 6.9 g/dL (ref 6.0–8.3)
Total Bilirubin: 0.9 mg/dL (ref 0.2–1.2)

## 2014-07-26 LAB — CBC WITH DIFFERENTIAL/PLATELET
BASOS ABS: 0.1 10*3/uL (ref 0.0–0.1)
BASOS PCT: 0.8 % (ref 0.0–3.0)
EOS ABS: 0.3 10*3/uL (ref 0.0–0.7)
EOS PCT: 3.9 % (ref 0.0–5.0)
HEMATOCRIT: 44.2 % (ref 39.0–52.0)
Hemoglobin: 14.7 g/dL (ref 13.0–17.0)
Lymphocytes Relative: 28.8 % (ref 12.0–46.0)
Lymphs Abs: 2 10*3/uL (ref 0.7–4.0)
MCHC: 33.2 g/dL (ref 30.0–36.0)
MCV: 94 fl (ref 78.0–100.0)
MONO ABS: 0.5 10*3/uL (ref 0.1–1.0)
Monocytes Relative: 6.5 % (ref 3.0–12.0)
NEUTROS PCT: 60 % (ref 43.0–77.0)
Neutro Abs: 4.3 10*3/uL (ref 1.4–7.7)
PLATELETS: 141 10*3/uL — AB (ref 150.0–400.0)
RBC: 4.7 Mil/uL (ref 4.22–5.81)
RDW: 12.8 % (ref 11.5–15.5)
WBC: 7.1 10*3/uL (ref 4.0–10.5)

## 2014-07-26 LAB — LIPID PANEL
CHOLESTEROL: 191 mg/dL (ref 0–200)
HDL: 42.8 mg/dL (ref >39.00)
LDL CALC: 111 mg/dL — AB (ref 0–99)
NONHDL: 148.2
Total CHOL/HDL Ratio: 4
Triglycerides: 187 mg/dL — ABNORMAL HIGH (ref 0.0–149.0)
VLDL: 37.4 mg/dL (ref 0.0–40.0)

## 2014-07-26 LAB — TSH: TSH: 1.14 u[IU]/mL (ref 0.35–4.50)

## 2014-07-26 LAB — PSA: PSA: 1.87 ng/mL (ref 0.10–4.00)

## 2014-08-03 ENCOUNTER — Ambulatory Visit (INDEPENDENT_AMBULATORY_CARE_PROVIDER_SITE_OTHER): Payer: Commercial Managed Care - HMO | Admitting: Family Medicine

## 2014-08-03 ENCOUNTER — Encounter: Payer: Self-pay | Admitting: Family Medicine

## 2014-08-03 VITALS — BP 130/70 | HR 65 | Temp 97.3°F | Ht 68.0 in | Wt 175.0 lb

## 2014-08-03 DIAGNOSIS — Z0000 Encounter for general adult medical examination without abnormal findings: Secondary | ICD-10-CM

## 2014-08-03 MED ORDER — VALSARTAN-HYDROCHLOROTHIAZIDE 160-25 MG PO TABS: 1.0000 | ORAL_TABLET | Freq: Every day | ORAL | Status: DC

## 2014-08-03 NOTE — Progress Notes (Signed)
   Subjective:    Patient ID: Dustin Nielsen, male    DOB: 1946/06/23, 69 y.o.   MRN: [id number]  HPI Patient seen for complete physical. He has hypertension treated with valsartan HCTZ. Otherwise no medications. Past history of kidney stones. He is very active. Exercises several days per week. Nonsmoker. Colonoscopy up-to-date. Tetanus up-to-date. He declines shingles vaccine, flu vaccine, and Pneumovax.  Reviewed with no changes-other than father had MI age 71 and previous records suggested his mother had heart disease.  Records were corrected.  Past Medical History  Diagnosis Date  . Chicken pox   . Allergy   . Hypertension   . Chronic kidney disease     stones  . Asthma    Past Surgical History  Procedure Laterality Date  . Cholecystectomy      reports that he quit smoking about 40 years ago. His smoking use included Cigarettes. He has a 15 pack-year smoking history. He does not have any smokeless tobacco history on file. His alcohol and drug histories are not on file. family history includes Heart disease (age of onset: 9069) in his father; Hypertension in his father; Kidney disease (age of onset: 7543) in his mother; Stroke in his father. No Known Allergies    Review of Systems  Constitutional: Negative for fever, activity change, appetite change and fatigue.  HENT: Negative for congestion, ear pain and trouble swallowing.   Eyes: Negative for pain and visual disturbance.  Respiratory: Negative for cough, shortness of breath and wheezing.   Cardiovascular: Negative for chest pain and palpitations.  Gastrointestinal: Negative for nausea, vomiting, abdominal pain, diarrhea, constipation, blood in stool, abdominal distention and rectal pain.  Genitourinary: Negative for dysuria, hematuria and testicular pain.  Musculoskeletal: Negative for joint swelling and arthralgias.  Skin: Negative for rash.  Neurological: Negative for dizziness, syncope and headaches.  Hematological:  Negative for adenopathy.  Psychiatric/Behavioral: Negative for confusion and dysphoric mood.       Objective:   Physical Exam  Constitutional: He is oriented to person, place, and time. He appears well-developed and well-nourished. No distress.  HENT:  Head: Normocephalic and atraumatic.  Right Ear: External ear normal.  Left Ear: External ear normal.  Mouth/Throat: Oropharynx is clear and moist.  Eyes: Conjunctivae and EOM are normal. Pupils are equal, round, and reactive to light.  Neck: Normal range of motion. Neck supple. No thyromegaly present.  Cardiovascular: Normal rate, regular rhythm and normal heart sounds.   No murmur heard. Pulmonary/Chest: No respiratory distress. He has no wheezes. He has no rales.  Abdominal: Soft. Bowel sounds are normal. He exhibits no distension and no mass. There is no tenderness. There is no rebound and no guarding.  Musculoskeletal: He exhibits no edema.  Lymphadenopathy:    He has no cervical adenopathy.  Neurological: He is alert and oriented to person, place, and time. He displays normal reflexes. No cranial nerve deficit.  Skin: No rash noted.  Psychiatric: He has a normal mood and affect.          Assessment & Plan:  Complete physical. Labs reviewed with no major concerns. We've recommended flu vaccine, Pneumovax, and shingles vaccine and he declines all of these. Refill valsartan HCTZ for one year

## 2014-08-03 NOTE — Patient Instructions (Signed)
Let me know if you change your mind regarding flu vaccine, pneumonia, or shingles vaccines

## 2014-08-03 NOTE — Progress Notes (Signed)
Pre visit review using our clinic review tool, if applicable. No additional management support is needed unless otherwise documented below in the visit note. 

## 2015-04-24 ENCOUNTER — Encounter: Payer: Self-pay | Admitting: Family Medicine

## 2015-04-24 ENCOUNTER — Ambulatory Visit (INDEPENDENT_AMBULATORY_CARE_PROVIDER_SITE_OTHER): Payer: Commercial Managed Care - HMO | Admitting: Family Medicine

## 2015-04-24 VITALS — BP 138/80 | HR 56 | Temp 97.9°F | Ht 68.0 in | Wt 173.5 lb

## 2015-04-24 DIAGNOSIS — I10 Essential (primary) hypertension: Secondary | ICD-10-CM | POA: Diagnosis not present

## 2015-04-24 NOTE — Progress Notes (Signed)
Pre visit review using our clinic review tool, if applicable. No additional management support is needed unless otherwise documented below in the visit note. 

## 2015-04-24 NOTE — Progress Notes (Signed)
   Subjective:    Patient ID: Dustin Nielsen, male    DOB: 1946/06/12, 69 y.o.   MRN: [id number]  HPI Patient here for follow-up hypertension. He and his wife are preparing to head back to the [place] for 6 months. He does lots of swimming and walking while there. He remains on valsartan HCTZ. Did not take medication this morning. Doing well. Continues to exercise regularly. Nonsmoker. He declines all vaccinations flu vaccine and pneumonia vaccine. He had labs 9 months ago that were unremarkable.  Past Medical History  Diagnosis Date  . Chicken pox   . Allergy   . Hypertension   . Chronic kidney disease     stones  . Asthma    Past Surgical History  Procedure Laterality Date  . Cholecystectomy      reports that he quit smoking about 41 years ago. His smoking use included Cigarettes. He has a 15 pack-year smoking history. He does not have any smokeless tobacco history on file. His alcohol and drug histories are not on file. family history includes Heart disease (age of onset: 13) in his father; Hypertension in his father; Kidney disease (age of onset: 9) in his mother; Stroke in his father. No Known Allergies    Review of Systems  Constitutional: Negative for fatigue.  Eyes: Negative for visual disturbance.  Respiratory: Negative for cough, chest tightness and shortness of breath.   Cardiovascular: Negative for chest pain, palpitations and leg swelling.  Endocrine: Negative for polydipsia and polyuria.  Neurological: Negative for dizziness, syncope, weakness, light-headedness and headaches.       Objective:   Physical Exam  Constitutional: He is oriented to person, place, and time. He appears well-developed and well-nourished.  HENT:  Right Ear: External ear normal.  Left Ear: External ear normal.  Mouth/Throat: Oropharynx is clear and moist.  Eyes: Pupils are equal, round, and reactive to light.  Neck: Neck supple. No thyromegaly present.  Cardiovascular:  Normal rate and regular rhythm.   Pulmonary/Chest: Effort normal and breath sounds normal. No respiratory distress. He has no wheezes. He has no rales.  Musculoskeletal: He exhibits no edema.  Neurological: He is alert and oriented to person, place, and time.          Assessment & Plan:  Hypertension. Stable by home readings and reading here today. Continue regular aerobic exercise and low-sodium diet. Routine follow-up 6 months. Recheck labs then. Patient declines flu vaccine

## 2015-05-15 DIAGNOSIS — H2513 Age-related nuclear cataract, bilateral: Secondary | ICD-10-CM | POA: Diagnosis not present

## 2015-05-15 DIAGNOSIS — H43813 Vitreous degeneration, bilateral: Secondary | ICD-10-CM | POA: Diagnosis not present

## 2015-08-03 DIAGNOSIS — I639 Cerebral infarction, unspecified: Secondary | ICD-10-CM

## 2015-08-03 HISTORY — DX: Cerebral infarction, unspecified: I63.9

## 2015-08-07 ENCOUNTER — Other Ambulatory Visit: Payer: Self-pay | Admitting: Family Medicine

## 2015-10-31 ENCOUNTER — Other Ambulatory Visit (INDEPENDENT_AMBULATORY_CARE_PROVIDER_SITE_OTHER): Payer: Commercial Managed Care - HMO

## 2015-10-31 DIAGNOSIS — Z0000 Encounter for general adult medical examination without abnormal findings: Secondary | ICD-10-CM | POA: Diagnosis not present

## 2015-10-31 LAB — LIPID PANEL
Cholesterol: 178 mg/dL (ref 0–200)
HDL: 48.8 mg/dL (ref >39.00)
LDL CALC: 97 mg/dL (ref 0–99)
NONHDL: 129.07
Total CHOL/HDL Ratio: 4
Triglycerides: 158 mg/dL — ABNORMAL HIGH (ref 0.0–149.0)
VLDL: 31.6 mg/dL (ref 0.0–40.0)

## 2015-10-31 LAB — CBC WITH DIFFERENTIAL/PLATELET
BASOS ABS: 0 10*3/uL (ref 0.0–0.1)
Basophils Relative: 0.8 % (ref 0.0–3.0)
EOS ABS: 0.4 10*3/uL (ref 0.0–0.7)
Eosinophils Relative: 6.8 % — ABNORMAL HIGH (ref 0.0–5.0)
HCT: 43.8 % (ref 39.0–52.0)
Hemoglobin: 14.8 g/dL (ref 13.0–17.0)
Lymphocytes Relative: 35.9 % (ref 12.0–46.0)
Lymphs Abs: 2.1 10*3/uL (ref 0.7–4.0)
MCHC: 33.7 g/dL (ref 30.0–36.0)
MCV: 93 fl (ref 78.0–100.0)
Monocytes Absolute: 0.3 10*3/uL (ref 0.1–1.0)
Monocytes Relative: 5.5 % (ref 3.0–12.0)
NEUTROS PCT: 51 % (ref 43.0–77.0)
Neutro Abs: 3 10*3/uL (ref 1.4–7.7)
PLATELETS: 169 10*3/uL (ref 150.0–400.0)
RBC: 4.71 Mil/uL (ref 4.22–5.81)
RDW: 13.6 % (ref 11.5–15.5)
WBC: 5.9 10*3/uL (ref 4.0–10.5)

## 2015-10-31 LAB — BASIC METABOLIC PANEL
BUN: 14 mg/dL (ref 6–23)
CALCIUM: 9.9 mg/dL (ref 8.4–10.5)
CHLORIDE: 102 meq/L (ref 96–112)
CO2: 31 meq/L (ref 19–32)
CREATININE: 1.04 mg/dL (ref 0.40–1.50)
GFR: 74.58 mL/min (ref >60.00)
Glucose, Bld: 98 mg/dL (ref 70–99)
Potassium: 3.5 mEq/L (ref 3.5–5.1)
Sodium: 141 mEq/L (ref 135–145)

## 2015-10-31 LAB — TSH: TSH: 1.01 u[IU]/mL (ref 0.35–4.50)

## 2015-10-31 LAB — HEPATIC FUNCTION PANEL
ALT: 20 U/L (ref 0–53)
AST: 20 U/L (ref 0–37)
Albumin: 4.1 g/dL (ref 3.5–5.2)
Alkaline Phosphatase: 61 U/L (ref 39–117)
BILIRUBIN DIRECT: 0.1 mg/dL (ref 0.0–0.3)
BILIRUBIN TOTAL: 0.9 mg/dL (ref 0.2–1.2)
Total Protein: 7 g/dL (ref 6.0–8.3)

## 2015-10-31 LAB — PSA: PSA: 1.64 ng/mL (ref 0.10–4.00)

## 2015-11-03 ENCOUNTER — Other Ambulatory Visit: Payer: Self-pay | Admitting: Family Medicine

## 2015-11-06 ENCOUNTER — Encounter: Payer: Self-pay | Admitting: Family Medicine

## 2015-11-06 ENCOUNTER — Ambulatory Visit (INDEPENDENT_AMBULATORY_CARE_PROVIDER_SITE_OTHER): Payer: Commercial Managed Care - HMO | Admitting: Family Medicine

## 2015-11-06 VITALS — BP 154/80 | HR 74 | Temp 98.4°F | Ht 68.0 in | Wt 176.0 lb

## 2015-11-06 DIAGNOSIS — Z8673 Personal history of transient ischemic attack (TIA), and cerebral infarction without residual deficits: Secondary | ICD-10-CM | POA: Insufficient documentation

## 2015-11-06 DIAGNOSIS — I10 Essential (primary) hypertension: Secondary | ICD-10-CM | POA: Diagnosis not present

## 2015-11-06 DIAGNOSIS — Z0000 Encounter for general adult medical examination without abnormal findings: Secondary | ICD-10-CM | POA: Diagnosis not present

## 2015-11-06 MED ORDER — AMLODIPINE BESYLATE 5 MG PO TABS: 5.0000 mg | ORAL_TABLET | Freq: Every day | ORAL | Status: DC

## 2015-11-06 MED ORDER — ATORVASTATIN CALCIUM 10 MG PO TABS: 10.0000 mg | ORAL_TABLET | Freq: Every day | ORAL | Status: DC

## 2015-11-06 NOTE — Progress Notes (Signed)
Subjective:    Patient ID: Dustin Nielsen, male    DOB: 1946/02/09, 70 y.o.   MRN: [id number]  HPI  Patient here for physical exam. He has history of hypertension treated with valsartan HCTZ.  Patient has a home in the [place] and splits his time between there and here. He was visiting there in January and states that on January 13 he developed some numbness without associated weakness in the left forearm and hand. 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 We have no details at this time. It sounds as if he had MRI the brain and carotid Dopplers along with echocardiogram and some type of cardiac stress test. He states that all those were unremarkable. He was started on baby aspirin 81 mg daily.   He's not had any recurrent symptoms or progressive symptoms since then. No chest pains. No weakness. No confusion. Remains on valsartan HCTZ. Home blood pressures usually ranging around 100 4150 systolic and 80s diastolic. Recent lab significant for LDL cholesterol 97.  Past Medical History  Diagnosis Date  . Chicken pox   . Allergy   . Hypertension   . Chronic kidney disease     stones  . Asthma    Past Surgical History  Procedure Laterality Date  . Cholecystectomy      reports that he quit smoking about 41 years ago. His smoking use included Cigarettes. He has a 15 pack-year smoking history. He does not have any smokeless tobacco history on file. His alcohol and drug histories are not on file. family history includes Heart disease (age of onset: 25) in his father; Hypertension in his father; Kidney disease (age of onset: 77) in his mother; Stroke in his father. No Known Allergies    Review of Systems  Constitutional: Negative for fever, activity change, appetite change, fatigue and unexpected weight change.  HENT: Negative for congestion, ear pain and trouble swallowing.   Eyes: Negative for pain and visual disturbance.  Respiratory: Negative  for cough, chest tightness, shortness of breath and wheezing.   Cardiovascular: Negative for chest pain, palpitations and leg swelling.  Gastrointestinal: Negative for nausea, vomiting, abdominal pain, diarrhea, constipation, blood in stool, abdominal distention and rectal pain.  Endocrine: Negative for polydipsia and polyuria.  Genitourinary: Negative for dysuria, hematuria and testicular pain.  Musculoskeletal: Negative for joint swelling and arthralgias.  Skin: Negative for rash.  Neurological: Negative for dizziness, syncope, weakness, light-headedness and headaches.  Hematological: Negative for adenopathy.  Psychiatric/Behavioral: Negative for confusion and dysphoric mood.       Objective:   Physical Exam  Constitutional: He is oriented to person, place, and time. He appears well-developed and well-nourished. No distress.  HENT:  Head: Normocephalic and atraumatic.  Right Ear: External ear normal.  Left Ear: External ear normal.  Mouth/Throat: Oropharynx is clear and moist.  Eyes: Conjunctivae and EOM are normal. Pupils are equal, round, and reactive to light.  Neck: Normal range of motion. Neck supple. No thyromegaly present.  Cardiovascular: Normal rate, regular rhythm and normal heart sounds.   No murmur heard. Pulmonary/Chest: No respiratory distress. He has no wheezes. He has no rales.  Abdominal: Soft. Bowel sounds are normal. He exhibits no distension and no mass. There is no tenderness. There is no rebound and no guarding.  Musculoskeletal: He exhibits no edema.  Lymphadenopathy:    He has no cervical adenopathy.  Neurological: He is alert and oriented to person, place, and time. He displays normal  reflexes. No cranial nerve deficit. Coordination normal.  Full strength upper and lower extremities.  Skin: No rash noted.  Psychiatric: He has a normal mood and affect.          Assessment & Plan:   Physical exam. Patient has never had shingles vaccine and he declines  this vaccine along with Pneumovax and Prevnar. He also does not get regular flu vaccines. He is aware of risk of not getting these vaccinations. Colonoscopy up-to-date. 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 Patient had extensive workup and we have asked that he get those records to us. Continue aspirin 81 mg daily.  Hypertension sub-optimal control. Add amlodipine 5 mg daily to his valsartan- HCTZ Labs reviewed. LDL cholesterol 97 with goal less than 70. Start  Lipitor 10 mg once daily. Recheck fasting lipids in 6 weeks and reassess blood pressure then

## 2015-11-06 NOTE — Patient Instructions (Signed)
Bring copy of hospital record from the DR.   Let's plan 6 week follow up after starting the new BP and cholesterol medication.

## 2015-11-06 NOTE — Progress Notes (Signed)
Pre visit review using our clinic review tool, if applicable. No additional management support is needed unless otherwise documented below in the visit note. 

## 2016-01-09 ENCOUNTER — Ambulatory Visit (INDEPENDENT_AMBULATORY_CARE_PROVIDER_SITE_OTHER): Payer: Commercial Managed Care - HMO | Admitting: Family Medicine

## 2016-01-09 ENCOUNTER — Encounter: Payer: Self-pay | Admitting: Family Medicine

## 2016-01-09 VITALS — BP 122/74 | HR 69 | Temp 98.0°F | Ht 68.0 in | Wt 174.0 lb

## 2016-01-09 DIAGNOSIS — E785 Hyperlipidemia, unspecified: Secondary | ICD-10-CM

## 2016-01-09 DIAGNOSIS — Z8673 Personal history of transient ischemic attack (TIA), and cerebral infarction without residual deficits: Secondary | ICD-10-CM

## 2016-01-09 DIAGNOSIS — I10 Essential (primary) hypertension: Secondary | ICD-10-CM | POA: Diagnosis not present

## 2016-01-09 NOTE — Progress Notes (Signed)
   Subjective:    Patient ID: Dustin Nielsen, male    DOB: 27-Apr-1946, 70 y.o.   MRN: [id number]  HPI  Patient seen for follow-up hypertension and hyperlipidemia. Refer to last note. He had right brain CVA back in January. Recent LDL cholesterol 97 with goal of less than 70 and we started low-dose Lipitor. Tolerating with no side effects. No residual effects from CVA.    Hypertension. Recent blood pressure 154/80. Started amlodipine which she takes in addition to valsartan and HCTZ. Blood pressure much improved. No dizziness. No peripheral edema issues. No recurrent numbness or weakness.  Unfortunately, he has not been taking his aspirin. He did not relalize he was supposed to stay on that in combination with these other medications. He had extensive workup with echocardiogram, MRI, carotid Dopplers, cardiac stress test when he was in the [place].  Past Medical History  Diagnosis Date  . Chicken pox   . Allergy   . Hypertension   . Chronic kidney disease     stones  . Asthma   . Stroke [facility]) 08-03-15    Left forearm and hand paresthesias   Past Surgical History  Procedure Laterality Date  . Cholecystectomy      reports that he quit smoking about 42 years ago. His smoking use included Cigarettes. He has a 15 pack-year smoking history. He does not have any smokeless tobacco history on file. His alcohol and drug histories are not on file. family history includes Heart disease (age of onset: 3469) in his father; Hypertension in his father; Kidney disease (age of onset: 5743) in his mother; Stroke in his father. No Known Allergies   Review of Systems  Constitutional: Negative for fatigue.  Eyes: Negative for visual disturbance.  Respiratory: Negative for cough, chest tightness and shortness of breath.   Cardiovascular: Negative for chest pain, palpitations and leg swelling.  Endocrine: Negative for polydipsia and polyuria.  Neurological: Negative for dizziness, syncope,  weakness, light-headedness and headaches.       Objective:   Physical Exam  Constitutional: He is oriented to person, place, and time. He appears well-developed and well-nourished.  HENT:  Right Ear: External ear normal.  Left Ear: External ear normal.  Mouth/Throat: Oropharynx is clear and moist.  Eyes: Pupils are equal, round, and reactive to light.  Neck: Neck supple. No thyromegaly present.  Cardiovascular: Normal rate and regular rhythm.   Pulmonary/Chest: Effort normal and breath sounds normal. No respiratory distress. He has no wheezes. He has no rales.  Musculoskeletal: He exhibits no edema.  Neurological: He is alert and oriented to person, place, and time.          Assessment & Plan:  #1 history of CVA. We discussed secondary prevention. Strongly advocate getting back on aspirin 81 mg daily. We discussed importance of lipid and blood pressure control. He has no history of diabetes.  #2 hypertension. Greatly improved with recent addition of amlodipine. Continue current regimen  #3 dyslipidemia. Recent initiation of Lipitor. Recheck lipid and hepatic panel.  Kristian CoveyBruce W Wylie Coon MD [facility]

## 2016-01-09 NOTE — Progress Notes (Signed)
Pre visit review using our clinic review tool, if applicable. No additional management support is needed unless otherwise documented below in the visit note. 

## 2016-01-09 NOTE — Patient Instructions (Signed)
Start back aspirin 81 mg daily. Continue with all of your other usual medications.

## 2016-03-31 ENCOUNTER — Other Ambulatory Visit: Payer: Self-pay | Admitting: *Deleted

## 2016-03-31 MED ORDER — VALSARTAN-HYDROCHLOROTHIAZIDE 160-25 MG PO TABS: 1.0000 | ORAL_TABLET | Freq: Every day | ORAL | 1 refills | Status: DC

## 2016-05-02 ENCOUNTER — Telehealth: Payer: Self-pay | Admitting: Family Medicine

## 2016-05-02 MED ORDER — VALSARTAN-HYDROCHLOROTHIAZIDE 160-25 MG PO TABS: 1.0000 | ORAL_TABLET | Freq: Every day | ORAL | 1 refills | Status: DC

## 2016-05-02 NOTE — Telephone Encounter (Signed)
Pt needs new rx valsartan-hctz 160-25 mg #90 w/refills send to [facility] mail order

## 2016-05-02 NOTE — Telephone Encounter (Signed)
Appts UTD. Medication refilled for pt.

## 2016-11-12 ENCOUNTER — Encounter: Payer: Self-pay | Admitting: Family Medicine

## 2016-11-12 ENCOUNTER — Ambulatory Visit (INDEPENDENT_AMBULATORY_CARE_PROVIDER_SITE_OTHER): Payer: Medicare HMO | Admitting: Family Medicine

## 2016-11-12 VITALS — BP 120/84 | HR 64 | Temp 98.4°F | Ht 69.0 in | Wt 161.6 lb

## 2016-11-12 DIAGNOSIS — Z8673 Personal history of transient ischemic attack (TIA), and cerebral infarction without residual deficits: Secondary | ICD-10-CM | POA: Diagnosis not present

## 2016-11-12 DIAGNOSIS — Z0000 Encounter for general adult medical examination without abnormal findings: Secondary | ICD-10-CM

## 2016-11-12 DIAGNOSIS — I10 Essential (primary) hypertension: Secondary | ICD-10-CM

## 2016-11-12 DIAGNOSIS — E785 Hyperlipidemia, unspecified: Secondary | ICD-10-CM | POA: Diagnosis not present

## 2016-11-12 LAB — BASIC METABOLIC PANEL
BUN: 13 mg/dL (ref 6–23)
CALCIUM: 10.3 mg/dL (ref 8.4–10.5)
CHLORIDE: 100 meq/L (ref 96–112)
CO2: 35 mEq/L — ABNORMAL HIGH (ref 19–32)
CREATININE: 0.98 mg/dL (ref 0.40–1.50)
GFR: 79.65 mL/min (ref >60.00)
Glucose, Bld: 87 mg/dL (ref 70–99)
Potassium: 3.8 mEq/L (ref 3.5–5.1)
Sodium: 141 mEq/L (ref 135–145)

## 2016-11-12 LAB — LIPID PANEL
Cholesterol: 183 mg/dL (ref 0–200)
HDL: 58.1 mg/dL (ref >39.00)
LDL Cholesterol: 101 mg/dL — ABNORMAL HIGH (ref 0–99)
NONHDL: 124.86
Total CHOL/HDL Ratio: 3
Triglycerides: 119 mg/dL (ref 0.0–149.0)
VLDL: 23.8 mg/dL (ref 0.0–40.0)

## 2016-11-12 LAB — HEPATIC FUNCTION PANEL
ALT: 18 U/L (ref 0–53)
AST: 19 U/L (ref 0–37)
Albumin: 4.2 g/dL (ref 3.5–5.2)
Alkaline Phosphatase: 62 U/L (ref 39–117)
BILIRUBIN DIRECT: 0.1 mg/dL (ref 0.0–0.3)
BILIRUBIN TOTAL: 0.7 mg/dL (ref 0.2–1.2)
Total Protein: 7.1 g/dL (ref 6.0–8.3)

## 2016-11-12 MED ORDER — VALSARTAN-HYDROCHLOROTHIAZIDE 160-25 MG PO TABS: 1.0000 | ORAL_TABLET | Freq: Every day | ORAL | 3 refills | Status: DC

## 2016-11-12 NOTE — Progress Notes (Signed)
Subjective:     Patient ID: Dustin Nielsen, male   DOB: January 15, 1946, 71 y.o.   MRN: [id number]  HPI   Patient seen for physical exam. He splits his time during the year between here in the [place] and the [place]. His past medical problems include history of hypertension, dyslipidemia, history of TIA. He takes aspirin one daily and valsartan and HCTZ. He quit taking Lipitor during the past year. Denied any side effects. He states he did not want any "unnecessary chemicals "in his body.  Denies any recent chest pains, dyspnea, or focal weakness. His father died of MI age 41. He walks regularly for exercise.  Colonoscopy up-to-date. He declines pneumonia vaccination and flu vaccines.  Past Medical History:  Diagnosis Date  . Allergy   . Asthma   . Chicken pox   . Chronic kidney disease    stones  . Hypertension   . Stroke [facility]) 08-03-15   Left forearm and hand paresthesias   Past Surgical History:  Procedure Laterality Date  . CHOLECYSTECTOMY      reports that he quit smoking about 42 years ago. His smoking use included Cigarettes. He has a 15.00 pack-year smoking history. He has never used smokeless tobacco. His alcohol and drug histories are not on file. family history includes Heart disease (age of onset: 27) in his father; Hypertension in his father; Kidney disease (age of onset: 79) in his mother; Stroke in his father. No Known Allergies   Review of Systems  Constitutional: Negative for activity change, appetite change, fatigue and fever.  HENT: Negative for congestion, ear pain and trouble swallowing.   Eyes: Negative for pain and visual disturbance.  Respiratory: Negative for cough, shortness of breath and wheezing.   Cardiovascular: Negative for chest pain and palpitations.  Gastrointestinal: Negative for abdominal distention, abdominal pain, blood in stool, constipation, diarrhea, nausea, rectal pain and vomiting.  Genitourinary: Negative for dysuria,  hematuria and testicular pain.  Musculoskeletal: Negative for arthralgias and joint swelling.  Skin: Negative for rash.  Neurological: Negative for dizziness, syncope and headaches.  Hematological: Negative for adenopathy.  Psychiatric/Behavioral: Negative for confusion and dysphoric mood.       Objective:   Physical Exam  Constitutional: He is oriented to person, place, and time. He appears well-developed and well-nourished. No distress.  HENT:  Head: Normocephalic and atraumatic.  Right Ear: External ear normal.  Left Ear: External ear normal.  Mouth/Throat: Oropharynx is clear and moist.  Eyes: Conjunctivae and EOM are normal. Pupils are equal, round, and reactive to light.  Neck: Normal range of motion. Neck supple. No thyromegaly present.  Cardiovascular: Normal rate, regular rhythm and normal heart sounds.   No murmur heard. Pulmonary/Chest: No respiratory distress. He has no wheezes. He has no rales.  Abdominal: Soft. Bowel sounds are normal. He exhibits no distension and no mass. There is no tenderness. There is no rebound and no guarding.  Musculoskeletal: He exhibits no edema.  Lymphadenopathy:    He has no cervical adenopathy.  Neurological: He is alert and oriented to person, place, and time. He displays normal reflexes. No cranial nerve deficit.  Skin: No rash noted.  Psychiatric: He has a normal mood and affect.       Assessment:     Physical exam. Patient has chronic problems as above and he declines statin use.    Plan:     -Refilled valsartan and HCTZ for one year -Check labs including basic metabolic panel, lipid panel, hepatic panel -  The natural history of prostate cancer and ongoing controversy regarding screening and potential treatment outcomes of prostate cancer has been discussed with the patient. The meaning of a false positive PSA and a false negative PSA has been discussed. He indicates understanding of the limitations of this screening test and  wishes not to proceed with screening PSA testing. -Repeat colonoscopy in about 3 years -Encouraged to continue daily aspirin -Long discussion regarding rationale for statin use in patients with prior history of peripheral vascular disease or CVA and he declines  Kristian Covey MD [facility] at [facility]

## 2016-11-12 NOTE — Progress Notes (Signed)
Pre visit review using our clinic review tool, if applicable. No additional management support is needed unless otherwise documented below in the visit note. 

## 2016-11-12 NOTE — Patient Instructions (Signed)
Continue daily aspirin Continue with regular exercise such as walking Let us know if you change your mind about taking the Lipitor.

## 2017-08-25 ENCOUNTER — Other Ambulatory Visit: Payer: Self-pay | Admitting: Family Medicine

## 2020-09-28 DIAGNOSIS — I10 Essential (primary) hypertension: Secondary | ICD-10-CM | POA: Diagnosis not present

## 2020-09-28 DIAGNOSIS — Z79899 Other long term (current) drug therapy: Secondary | ICD-10-CM | POA: Diagnosis not present

## 2020-09-28 DIAGNOSIS — Z1159 Encounter for screening for other viral diseases: Secondary | ICD-10-CM | POA: Diagnosis not present

## 2020-09-28 DIAGNOSIS — D649 Anemia, unspecified: Secondary | ICD-10-CM | POA: Diagnosis not present

## 2020-09-28 DIAGNOSIS — E559 Vitamin D deficiency, unspecified: Secondary | ICD-10-CM | POA: Diagnosis not present

## 2020-09-28 DIAGNOSIS — J309 Allergic rhinitis, unspecified: Secondary | ICD-10-CM | POA: Diagnosis not present

## 2020-09-28 DIAGNOSIS — Z131 Encounter for screening for diabetes mellitus: Secondary | ICD-10-CM | POA: Diagnosis not present

## 2020-09-28 DIAGNOSIS — E785 Hyperlipidemia, unspecified: Secondary | ICD-10-CM | POA: Diagnosis not present

## 2020-09-28 DIAGNOSIS — E663 Overweight: Secondary | ICD-10-CM | POA: Diagnosis not present

## 2020-09-28 DIAGNOSIS — Z125 Encounter for screening for malignant neoplasm of prostate: Secondary | ICD-10-CM | POA: Diagnosis not present

## 2021-01-23 ENCOUNTER — Other Ambulatory Visit: Payer: Self-pay | Admitting: Family Medicine

## 2021-01-23 DIAGNOSIS — R4789 Other speech disturbances: Secondary | ICD-10-CM

## 2021-01-29 ENCOUNTER — Ambulatory Visit
Admission: RE | Admit: 2021-01-29 | Discharge: 2021-01-29 | Disposition: A | Discharge status: Home / Self Care | Payer: Medicare HMO | Source: Ambulatory Visit | Attending: Family Medicine | Admitting: Family Medicine

## 2021-01-29 DIAGNOSIS — R4789 Other speech disturbances: Secondary | ICD-10-CM

## 2021-02-15 ENCOUNTER — Other Ambulatory Visit: Payer: Self-pay | Admitting: Family Medicine

## 2021-02-15 DIAGNOSIS — Z8673 Personal history of transient ischemic attack (TIA), and cerebral infarction without residual deficits: Secondary | ICD-10-CM

## 2021-04-03 ENCOUNTER — Encounter: Payer: Self-pay | Admitting: Neurology

## 2021-04-12 NOTE — Progress Notes (Signed)
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  Also, based on age, consider cardioembolic source Hypertension Hyperlipidemia  Check MRI and MRA of head Check bilateral carotid ultrasound Complete echocardiogram Cardiac event monitor Repeat lipid panel as per PCP Secondary stroke prevention as managed by PCP: Recommended that he take full-dose 75mg  Plavix daily LDL goal less than 70.  Having repeat lipid panel performed by PCP - advised that if LDL not at goal, would recommend statin Hgb A1c less than 7 Follow up 6 months.   Subjective:  Dustin Nielsen is a 75 year old right-handed male with HTN, HLD, CKD and prior stroke who presents for stroke.  History supplemented by his accompanying her daughter and referring provider's note.  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  Patient had COVID-associated pneumonia in December 2021.  On 01/21/2021, he had an episode of getting words out - he knew what he wanted to say but could not say it.  It lasted 1 minute.  No other lateralizing symptoms.  MRI of brain without contrast on 01/29/2021 personally reviewed showed mild to moderate chronic small vessel ischemic changes in the cerebral white matter with old small right thalamic lacunar infarct.  The next day, he was outside gardening in the hot sun.  He wasn't keeping hydrated.  He then exhibited garbled speech lasting less than a minute.  Again, no lateralizing symptoms.  On 02/14/2021, he was on a plain coming [place] from [place] when he had trouble holding his spoon with his right hand.  He kept dropping it.  No slurred speech, aphasia,  facial droop.  It lasted a minute.  He soon found out that he got COVID again, but only mild cold-like symptoms.  He was switched from ASA to Plavix.  He hasn't had a recurrent spell since then.  LDL from March 2022 was 105.  He is not on a statin.  He is working with a naturopath to lower his cholesterol.  He is going to have another lipid panel checked soon by his PCP.  He also has only been taking 1/2 a Plavix daily because he doesn't like taking medications.     PAST MEDICAL HISTORY: Past Medical History:  Diagnosis Date   Allergy    Asthma    Chicken pox    Chronic kidney disease    stones   Hypertension    Stroke [facility]) 08-03-15   Left forearm and hand paresthesias    PAST SURGICAL HISTORY: Past Surgical History:  Procedure Laterality Date   CHOLECYSTECTOMY      MEDICATIONS: Current Outpatient Medications on File Prior to Visit  Medication Sig Dispense Refill   amLODipine (NORVASC) 5 MG tablet Take 1 tablet (5 mg total) by mouth daily. (Patient not taking: Reported on 11/12/2016) 90 tablet 3   valsartan-hydrochlorothiazide (DIOVAN-HCT) 160-25 MG tablet Take 1 tablet by mouth daily. 90 tablet 3   valsartan-hydrochlorothiazide (DIOVAN-HCT) 160-25 MG tablet TAKE 1 TABLET BY MOUTH DAILY 90 tablet 0   [DISCONTINUED] montelukast (SINGULAIR) 10 MG tablet Take 1 tablet (10 mg total) by mouth at bedtime. 30 tablet 11   No current facility-administered medications on  file prior to visit.    ALLERGIES: No Known Allergies  FAMILY HISTORY: Family History  Problem Relation Age of Onset   Kidney disease Mother 36       deceased   Stroke Father    Hypertension Father    Heart disease Father 58       MI    Objective:  Blood pressure (!) 145/79, pulse 73. General: No acute distress.  Patient appears well-groomed.   Head:  Normocephalic/atraumatic Eyes:  fundi examined but not visualized Neck: supple, no paraspinal tenderness, full range of motion Back: No paraspinal  tenderness Heart: regular rate and rhythm Lungs: Clear to auscultation bilaterally. Vascular: No carotid bruits. Neurological Exam: Mental status: alert and oriented to person, place, and time, recent and remote memory intact, fund of knowledge intact, attention and concentration intact, speech fluent and not dysarthric, language intact. Cranial nerves: CN I: not tested CN II: pupils equal, round and reactive to light, visual fields intact CN III, IV, VI:  full range of motion, no nystagmus, no ptosis CN V: facial sensation intact. CN VII: upper and lower face symmetric CN VIII: hearing intact CN IX, X: gag intact, uvula midline CN XI: sternocleidomastoid and trapezius muscles intact CN XII: tongue midline Bulk & Tone: normal, no fasciculations. Motor:  muscle strength 5/5 throughout Sensation:  Pinprick, temperature and vibratory sensation intact. Deep Tendon Reflexes:  2+ throughout,  toes downgoing.   Finger to nose testing:  Without dysmetria.   Heel to shin:  Without dysmetria.   Gait:  Normal station and stride.  Romberg negative.    Thank you for allowing me to take part in the care of this patient.  Shon Millet, DO  CC: [name], MD

## 2021-04-15 ENCOUNTER — Encounter: Payer: Self-pay | Admitting: Neurology

## 2021-04-15 ENCOUNTER — Other Ambulatory Visit: Payer: Self-pay

## 2021-04-15 ENCOUNTER — Ambulatory Visit: Payer: Medicare Other | Admitting: Neurology

## 2021-04-15 VITALS — BP 145/79 | HR 73

## 2021-04-15 DIAGNOSIS — G459 Transient cerebral ischemic attack, unspecified: Secondary | ICD-10-CM

## 2021-04-15 NOTE — Patient Instructions (Addendum)
I would take Plavix 75mg  daily for secondary stroke prevention Check MRI and MRA of brain Check carotid ultrasound Check complete echocardiogram Check cardiac event monitor Follow up in 6 months.

## 2021-04-22 ENCOUNTER — Ambulatory Visit
Admission: RE | Admit: 2021-04-22 | Discharge: 2021-04-22 | Disposition: A | Discharge status: Home / Self Care | Payer: Medicare Other | Source: Ambulatory Visit | Attending: Neurology | Admitting: Neurology

## 2021-04-22 ENCOUNTER — Other Ambulatory Visit: Payer: Self-pay

## 2021-04-22 DIAGNOSIS — G459 Transient cerebral ischemic attack, unspecified: Secondary | ICD-10-CM

## 2021-04-26 NOTE — Progress Notes (Signed)
Tried calling pt, No answer from him ,but his daughter number as primary, She is not on the DPR signed in may 2022. Daughter advised she will have the pt give [place] a call back.

## 2021-05-01 ENCOUNTER — Other Ambulatory Visit: Payer: Self-pay | Admitting: Neurology

## 2021-05-01 ENCOUNTER — Other Ambulatory Visit: Payer: Self-pay

## 2021-05-01 ENCOUNTER — Ambulatory Visit: Payer: Medicare Other

## 2021-05-01 DIAGNOSIS — G459 Transient cerebral ischemic attack, unspecified: Secondary | ICD-10-CM

## 2021-05-02 ENCOUNTER — Other Ambulatory Visit: Payer: Self-pay | Admitting: Neurology

## 2021-05-02 ENCOUNTER — Other Ambulatory Visit: Payer: Self-pay | Admitting: Cardiology

## 2021-05-02 ENCOUNTER — Telehealth: Payer: Self-pay

## 2021-05-02 DIAGNOSIS — G459 Transient cerebral ischemic attack, unspecified: Secondary | ICD-10-CM

## 2021-05-02 NOTE — Telephone Encounter (Signed)
Pt advised of his results for the  Ultrasound of the Carotid.  Pt daughter advised DPR filled out during last visit.  Reviewed chart it was scanned in 04/01/21, Allowing her to be added.  Advised will check with the front desk to make sure.   Front desk do you mind looking behind me please.

## 2021-05-02 NOTE — Telephone Encounter (Signed)
-----   Message from Drema Dallas, DO sent at 04/23/2021  7:03 AM EDT ----- Carotid ultrasound looks okay

## 2021-05-08 ENCOUNTER — Other Ambulatory Visit: Payer: Medicare Other

## 2021-05-15 ENCOUNTER — Ambulatory Visit
Admission: RE | Admit: 2021-05-15 | Discharge: 2021-05-15 | Disposition: A | Discharge status: Home / Self Care | Payer: Medicare Other | Source: Ambulatory Visit | Attending: Neurology | Admitting: Neurology

## 2021-05-28 NOTE — Progress Notes (Signed)
Pt advised of his MRI Results.

## 2021-05-28 NOTE — Progress Notes (Signed)
If you have suspicion for atrial fibrillation is high, would recommend loop recorder implantation.

## 2021-10-23 NOTE — Progress Notes (Deleted)
? ?NEUROLOGY FOLLOW UP OFFICE NOTE ? ?Dustin Nielsen ?[id number] ? ?Assessment/Plan:  ? ?Recurrent TIAs - likely small vessel disease but given two episodes of aphasia and one episode of right hand weakness, must also rule out a left MCA stenosis.  Also, based on age, consider cardioembolic source ?Hypertension ?Hyperlipidemia ?  ?Secondary stroke prevention as managed by PCP: ?Plavix 75mg  daily ?Statin.  LDL goal less than 70 ?Hgb A1c less than 7 ?Normotensive blood pressure ?Mediterranean diet ?Routine exercise ?Follow up 6 months. ?  ?  ?Subjective:  ?Dustin Nielsen is a 76 year old right-handed male with HTN, HLD, CKD and prior stroke who follows up for TIA.  MRI/MRA head personally reviewed. ? ?UPDATE: ?Current medications:  Plavix 75mg  daily, *** ? ?Underwent stroke workup: ?MRI and MRA of brain on 05/17/2021 showed right thalamic chronic lacunar infarct but no acute infarct or intracranial stenosis or LVO.  Carotid ultrasound on 04/22/2021 showed no hemodynamically significant stenosis.  2D echo on 05/01/2021 showed LVEF 60% with Grade I diastolic dysfunction, Grade III aortic regurg, mild to moderate tricuspid regurg, but no thrombus or atrial level shunt.  Cardiac event monitor from 05/01/2021 to 05/15/2021 showed 2 atrial tachycardia episodes, longest 9 beats, but no a fib or heart block  ?  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  Patient had COVID-associated pneumonia in December 2021.  On 01/21/2021, he had an episode of getting words out - he knew what he wanted to say but could not say it.  It lasted 1 minute.  No other lateralizing symptoms.  MRI of brain without contrast on 01/29/2021 personally reviewed showed mild to moderate chronic small vessel ischemic changes in the cerebral white matter with old small right thalamic lacunar infarct.  The next day, he was outside gardening in the hot sun.  He wasn't keeping hydrated.  He then exhibited garbled  speech lasting less than a minute.  Again, no lateralizing symptoms.  On 02/14/2021, he was on a plain coming back from [place]. 02/16/2021 when he had trouble holding his spoon with his right hand.  He kept dropping it.  No slurred speech, aphasia, facial droop.  It lasted a minute.  He soon found out that he got COVID again, but only mild cold-like symptoms.  He was switched from ASA to Plavix.  He hasn't had a recurrent spell since then.  LDL from March 2022 was 105.  He is not on a statin.  He is working with a naturopath to lower his cholesterol.  He is going to have another lipid panel checked soon by his PCP.  He also has only been taking 1/2 a Plavix daily because he doesn't like taking medications.   ? ?PAST MEDICAL HISTORY: ?Past Medical History:  ?Diagnosis Date  ? Allergy   ? Asthma   ? Chicken pox   ? Chronic kidney disease   ? stones  ? Hypertension   ? Stroke [facility]) 08-03-15  ? Left forearm and hand paresthesias  ? ? ?MEDICATIONS: ?Current Outpatient Medications on File Prior to Visit  ?Medication Sig Dispense Refill  ? amLODipine (NORVASC) 5 MG tablet Take 1 tablet (5 mg total) by mouth daily. 90 tablet 3  ? clopidogrel (PLAVIX) 75 MG tablet Take 75 mg by mouth daily.    ? valsartan-hydrochlorothiazide (DIOVAN-HCT) 160-25 MG tablet Take 1 tablet by mouth daily. 90 tablet 3  ? valsartan-hydrochlorothiazide (DIOVAN-HCT) 160-25 MG tablet TAKE 1 TABLET BY MOUTH DAILY 90  tablet 0  ? [DISCONTINUED] montelukast (SINGULAIR) 10 MG tablet Take 1 tablet (10 mg total) by mouth at bedtime. 30 tablet 11  ? ?No current facility-administered medications on file prior to visit.  ? ? ?ALLERGIES: ?No Known Allergies ? ?FAMILY HISTORY: ?Family History  ?Problem Relation Age of Onset  ? Kidney disease Mother 73  ?     deceased  ? Stroke Father   ? Hypertension Father   ? Heart disease Father 57  ?     MI  ? ? ?  ?Objective:  ?*** ?General: No acute distress.  Patient appears ***-groomed.   ?Head:  Normocephalic/atraumatic ?Eyes:   Fundi examined but not visualized ?Neck: supple, no paraspinal tenderness, full range of motion ?Heart:  Regular rate and rhythm ?Lungs:  Clear to auscultation bilaterally ?Back: No paraspinal tenderness ?Neurological Exam: alert and oriented to person, place, and time.  Speech fluent and not dysarthric, language intact.  CN II-XII intact. Bulk and tone normal, muscle strength 5/5 throughout.  Sensation to light touch intact.  Deep tendon reflexes 2+ throughout, toes downgoing.  Finger to nose testing intact.  Gait normal, Romberg negative. ? ? ?Shon Millet, DO ? ?CC: *** ? ? ? ? ? ? ?

## 2021-10-28 ENCOUNTER — Ambulatory Visit: Payer: Medicare Other | Admitting: Neurology

## 2022-07-10 IMAGING — MR MR HEAD W/O CM
11 series · 48 of 48 positions shown · non-contrast
Comparison: Brain MRI 01/29/2021

CLINICAL DATA: History of stroke.  No new symptoms

EXAM:
MRI HEAD WITHOUT CONTRAST
MRA HEAD WITHOUT CONTRAST
TECHNIQUE: Multiplanar, multi-echo pulse sequences of the brain and surrounding
structures were acquired without intravenous contrast. Angiographic
images of the Circle of Willis were acquired using MRA technique
without intravenous contrast.

[Series 2: T1 · sagittal · 5.0mm · 0.45mm/px · 2 of 21 slices shown (1 of 2)]
[im 1/21]
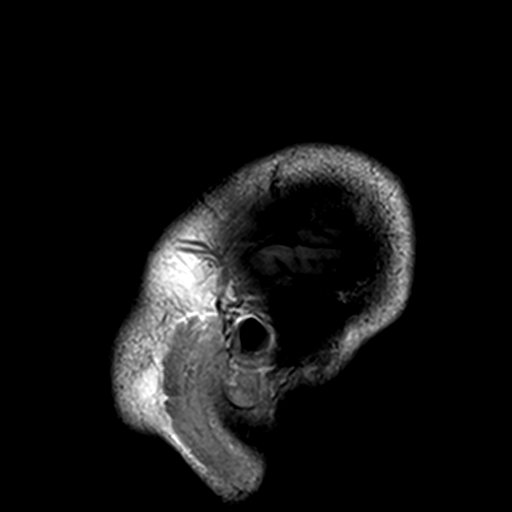
[im 21/21]
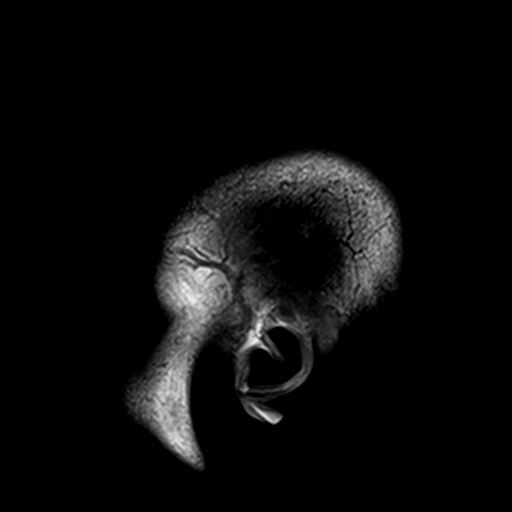

[Series 3: DWI · axial · 3.0mm · 1.80mm/px · z∈[-37,+109]mm · 9 of 100 slices shown (1 of 4)]
[im 1/100]
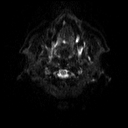
[im 13/100]
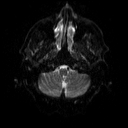
[im 25/100]
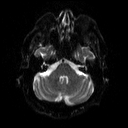
[im 38/100]
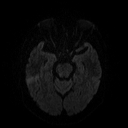
[im 50/100]
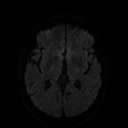
[im 62/100]
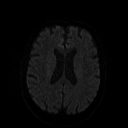
[im 75/100]
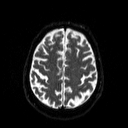
[im 87/100]
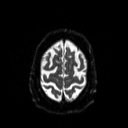
[im 100/100]
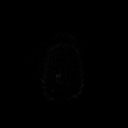

[Series 4: DWI · axial · 3.0mm · 1.80mm/px · z∈[-37,+109]mm · 4 of 50 slices shown (2 of 4)]
[im 1/50]
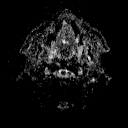
[im 17/50]
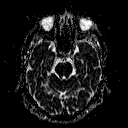
[im 33/50]
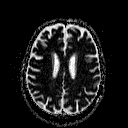
[im 50/50]
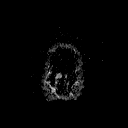

[Series 5: DWI · coronal · 5.0mm · 1.80mm/px · 6 of 68 slices shown (3 of 4)]
[im 1/68]
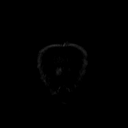
[im 14/68]
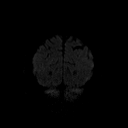
[im 27/68]
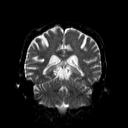
[im 41/68]
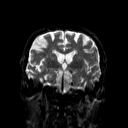
[im 54/68]
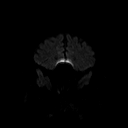
[im 68/68]
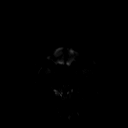

[Series 6: DWI · coronal · 5.0mm · 1.80mm/px · 3 of 34 slices shown (4 of 4)]
[im 1/34]
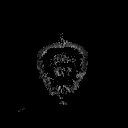
[im 17/34]
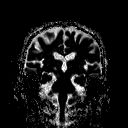
[im 34/34]
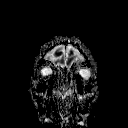

[Series 7: T2 · axial · 5.0mm · 0.51mm/px · z∈[-45,+100]mm · 2 of 22 slices shown (1 of 2)]
[im 1/22]
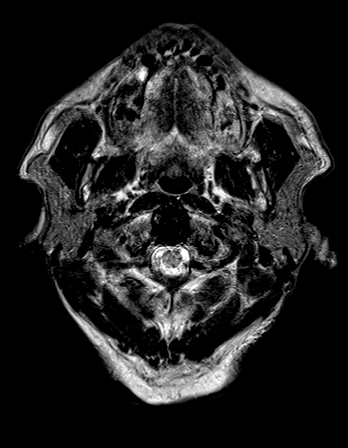
[im 22/22]
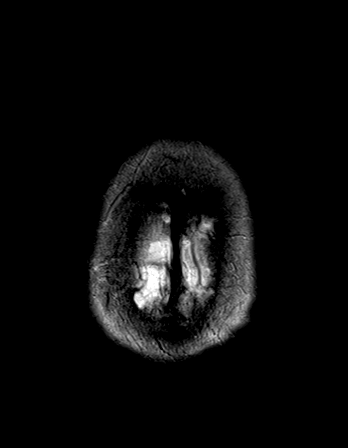

[Series 8: FLAIR · axial · 3.0mm · 0.45mm/px · z∈[-44,+99]mm · 3 of 32 slices shown]
[im 1/32]
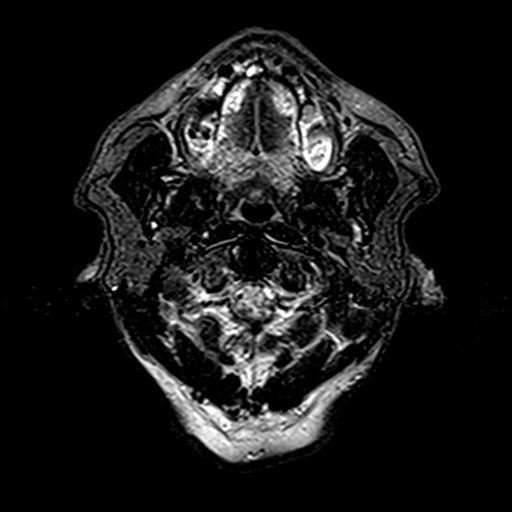
[im 16/32]
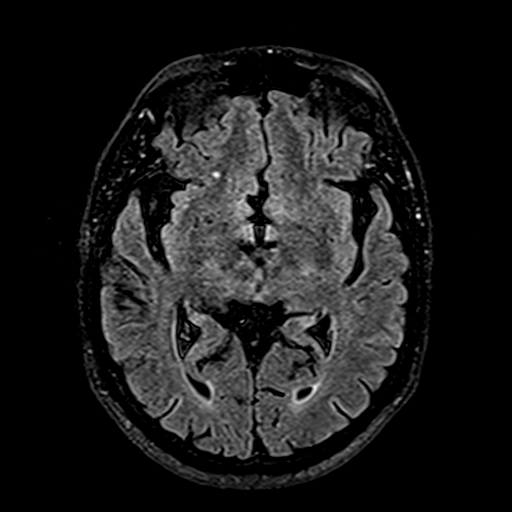
[im 32/32]
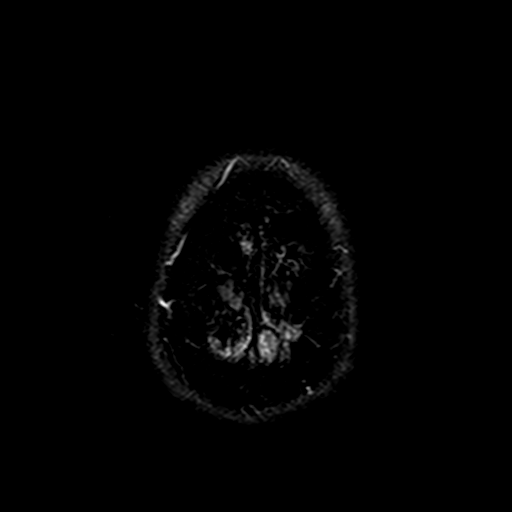

[Series 10: swi_images · axial · 4.0mm · 0.90mm/px · z∈[-42,+97]mm · 3 of 36 slices shown]
[im 1/36]
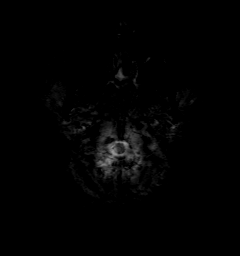
[im 18/36]
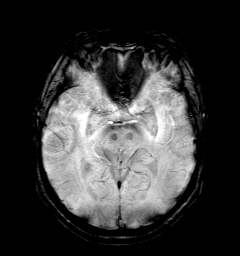
[im 36/36]
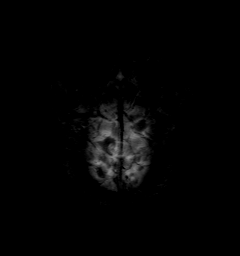

[Series 11: t1_mpr_tra · axial · 1.0mm · 0.71mm/px · z∈[-44,+98]mm · 12 of 144 slices shown]
[im 1/144]
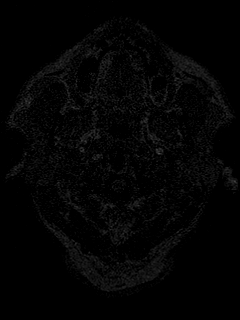
[im 14/144]
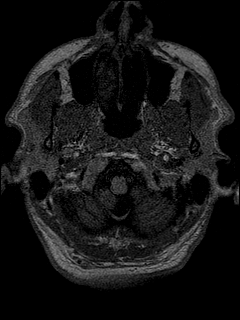
[im 27/144]
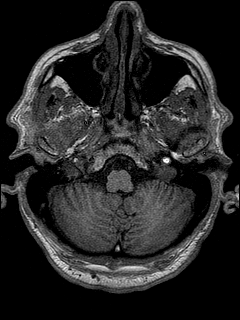
[im 40/144]
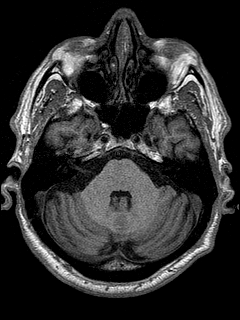
[im 53/144]
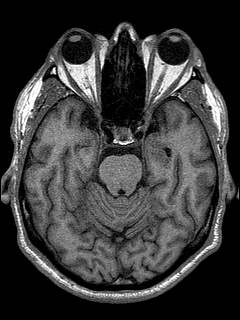
[im 66/144]
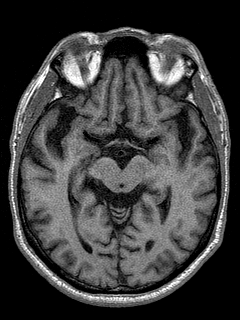
[im 79/144]
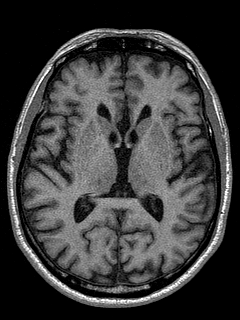
[im 92/144]
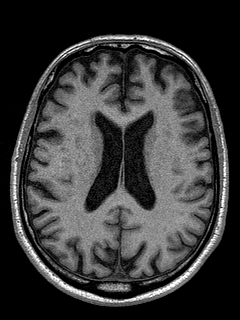
[im 105/144]
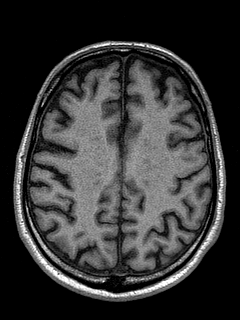
[im 118/144]
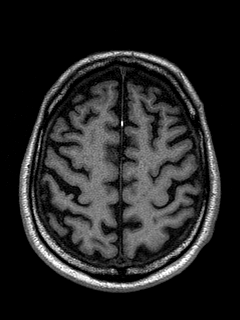
[im 131/144]
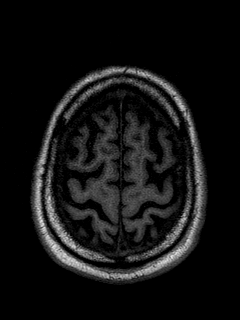
[im 144/144]
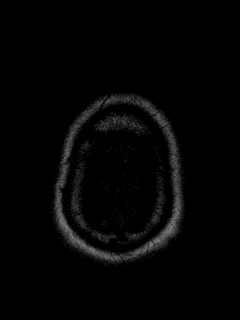

[Series 12: T2 · coronal · 5.0mm · 0.45mm/px · 2 of 27 slices shown (2 of 2)]
[im 1/27]
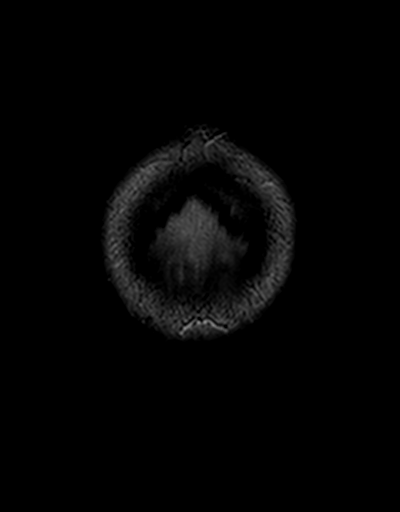
[im 27/27]
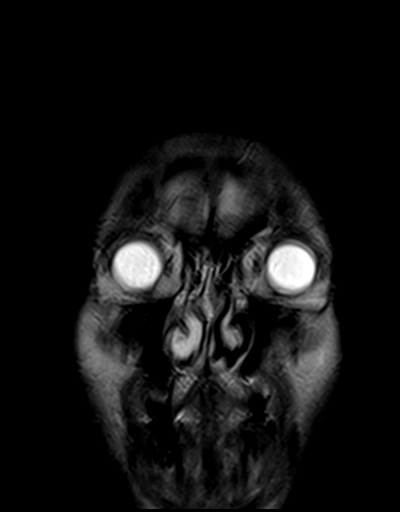

[Series 13: T1 · sagittal · 5.0mm · 0.45mm/px · 2 of 21 slices shown (2 of 2)]
[im 1/21]
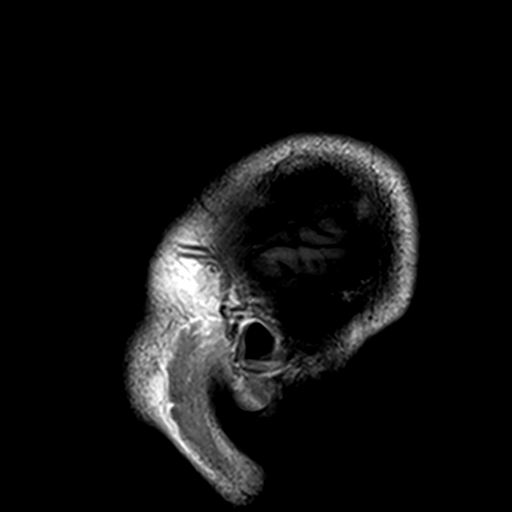
[im 21/21]
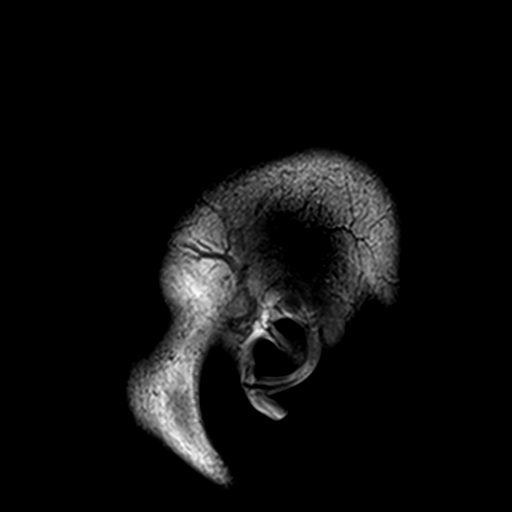

[48 of 48 positions shown; findings below may reference images not displayed]

FINDINGS: MRI HEAD FINDINGS

Brain: No acute infarction, hemorrhage, hydrocephalus, extra-axial
collection or mass lesion. Chronic lacunar infarct at the right
thalamus. Mild chronic small vessel ischemia in the hemispheric
white matter. Brain volume is normal

Vascular: Normal flow voids

Skull and upper cervical spine: Normal marrow signal

Sinuses/Orbits: Negative

MRA HEAD FINDINGS

Anterior circulation: Motion artifact at the level of the proximal
MCAs. Based on source images no flow limiting stenosis or beading
suspected. No aneurysm or vascular malformation

Posterior circulation: Mild left vertebral artery dominance.
Vertebral and basilar arteries are smoothly contoured and widely
patent. No branch occlusion, beading, or aneurysm

Anatomic variants: None significant
IMPRESSION: Brain MRI:

1. No emergent or reversible finding.
2. Chronic lacune at the right thalamus. Mild chronic small vessel
ischemia in the hemispheric white matter.

Intracranial MRA.

Negative motion degraded study.

## 2022-07-10 IMAGING — MR MR MRA HEAD W/O CM
1 series · 23 of 48 positions shown · non-contrast
Comparison: Brain MRI 01/29/2021

CLINICAL DATA: History of stroke.  No new symptoms

EXAM:
MRI HEAD WITHOUT CONTRAST
MRA HEAD WITHOUT CONTRAST
TECHNIQUE: Multiplanar, multi-echo pulse sequences of the brain and surrounding
structures were acquired without intravenous contrast. Angiographic
images of the Circle of Willis were acquired using MRA technique
without intravenous contrast.

[Series 2: tof_3d_multi-slab · axial · 0.7mm · 0.35mm/px · z∈[-37,+51]mm · 23 of 136 slices shown]
[im 1/136]
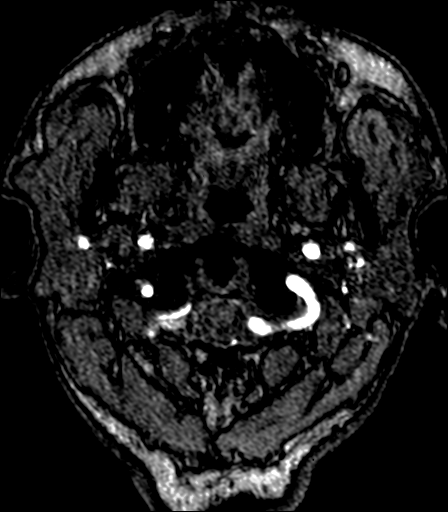
[im 3/136]
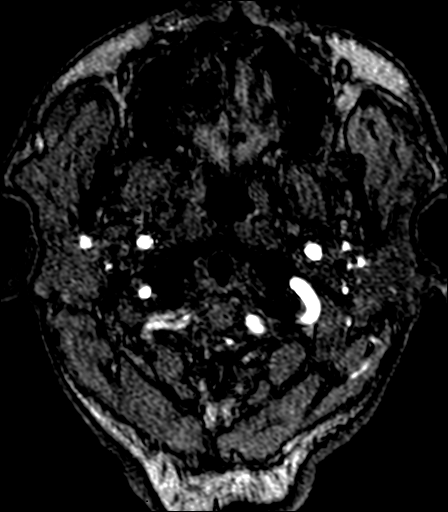
[im 6/136]
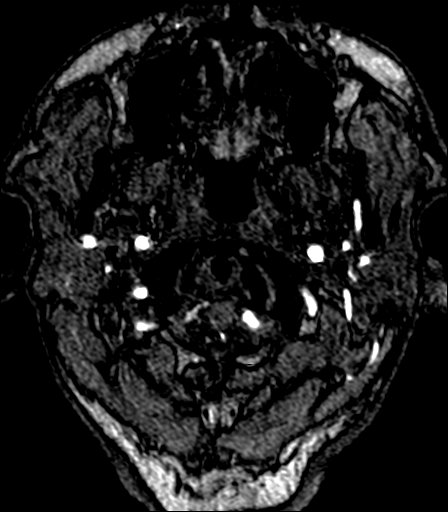
[im 9/136]
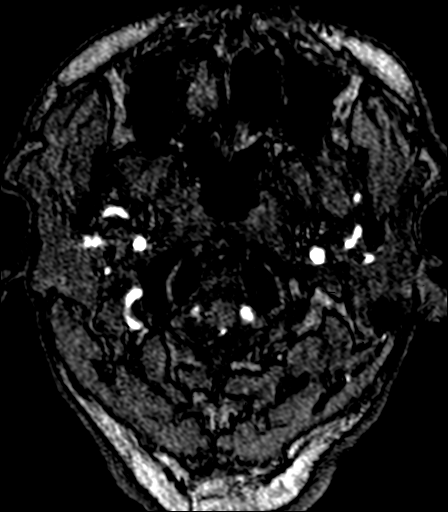
[im 12/136]
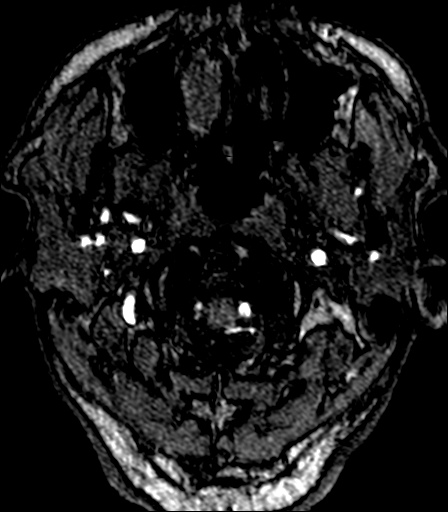
[im 15/136]
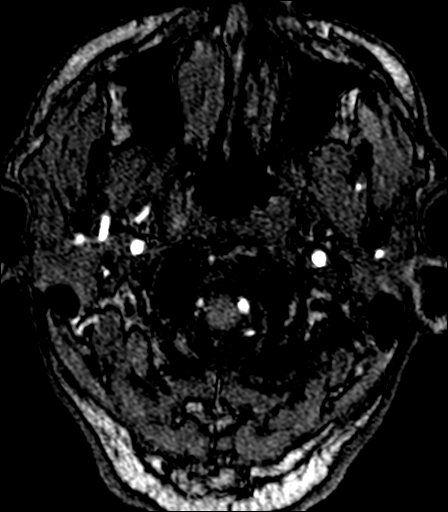
[im 18/136]
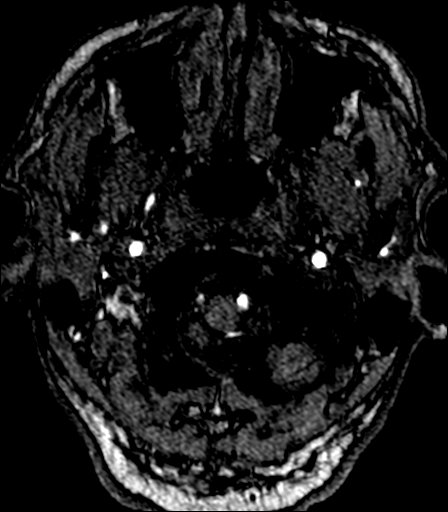
[im 21/136]
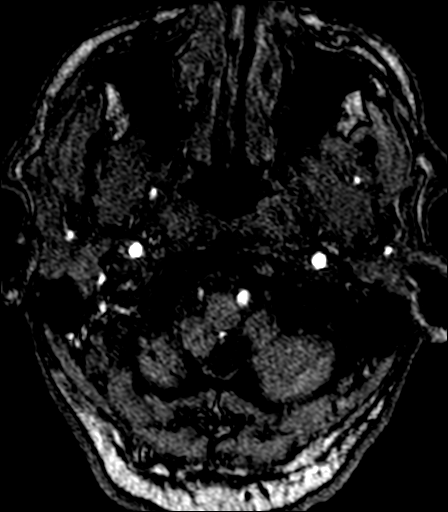
[im 23/136]
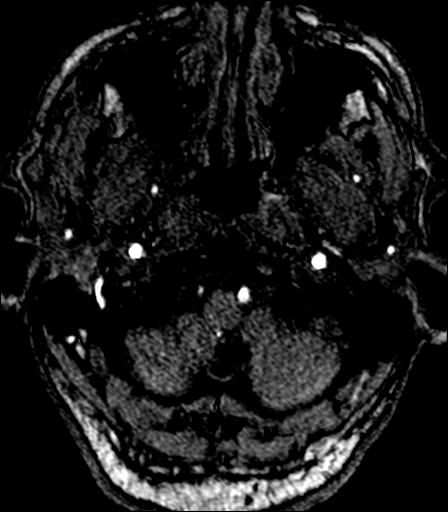
[im 26/136]
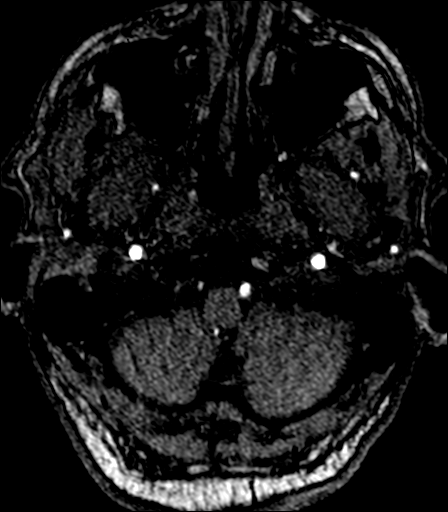
[im 29/136]
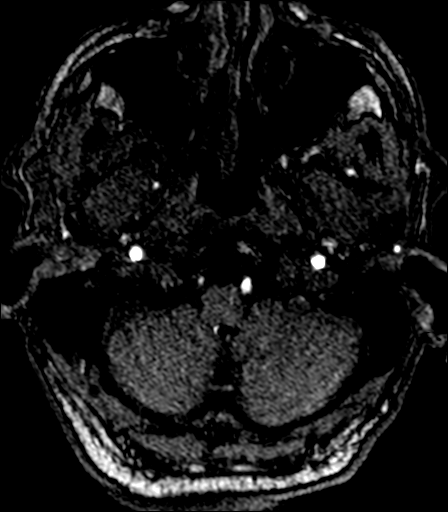
[im 32/136]
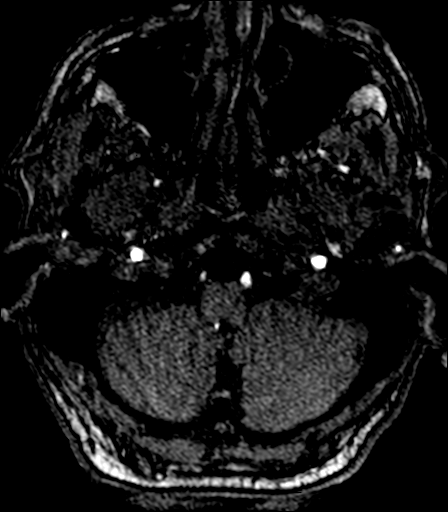
[im 35/136]
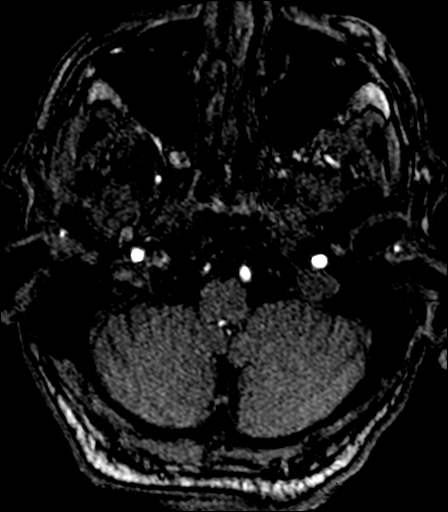
[im 38/136]
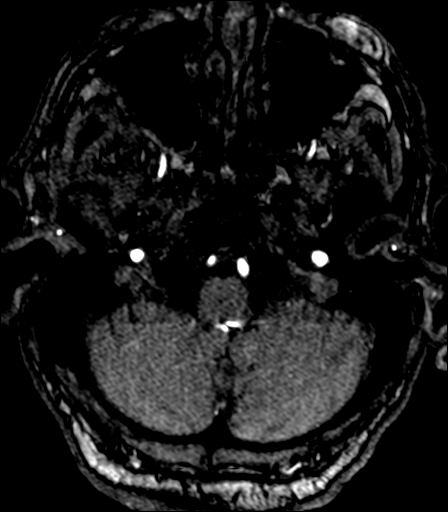
[im 41/136]
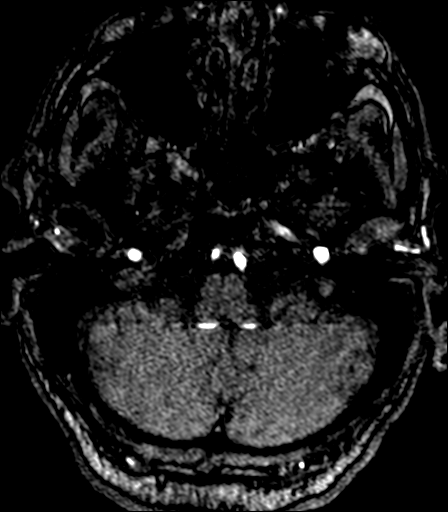
[im 44/136]
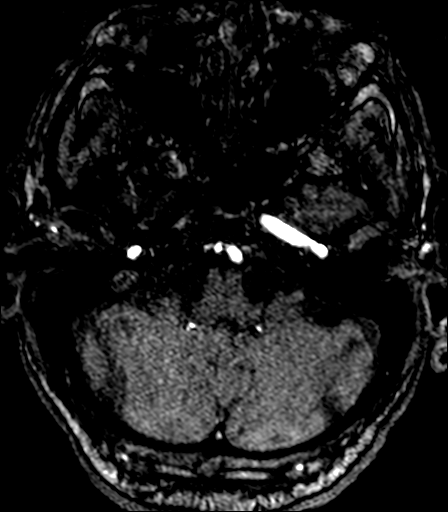
[im 61/136]
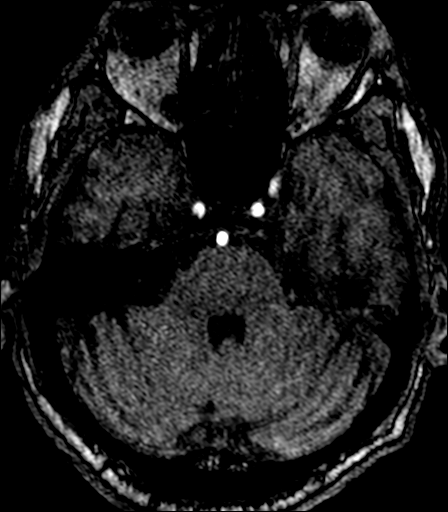
[im 69/136]
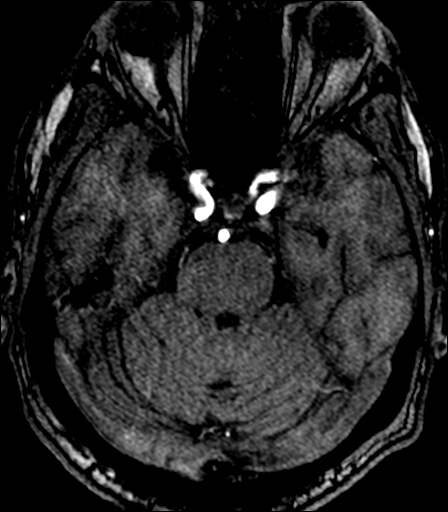
[im 78/136]
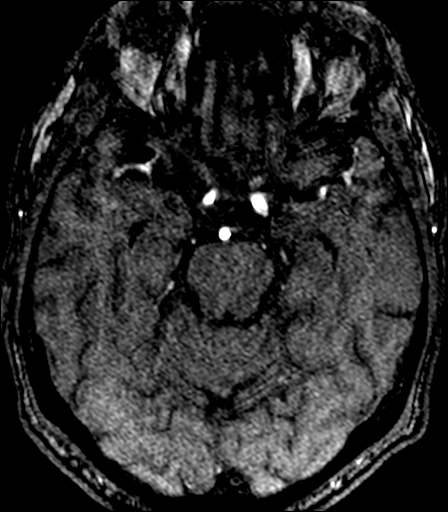
[im 95/136]
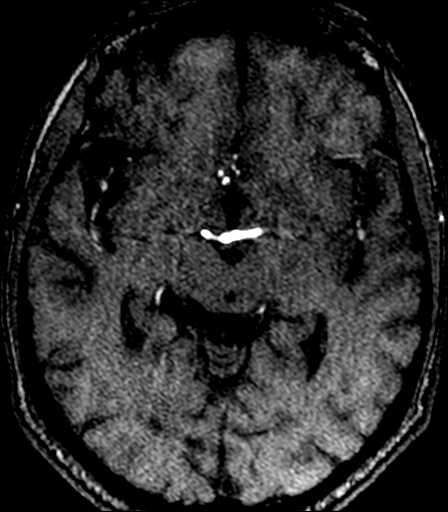
[im 113/136]
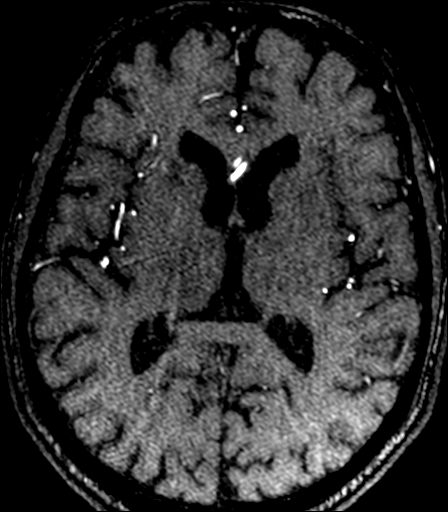
[im 115/136]
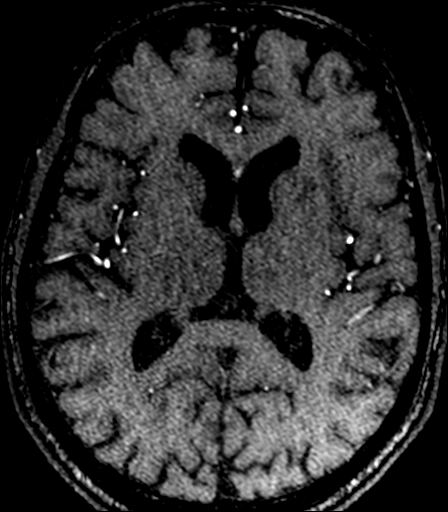
[im 130/136]
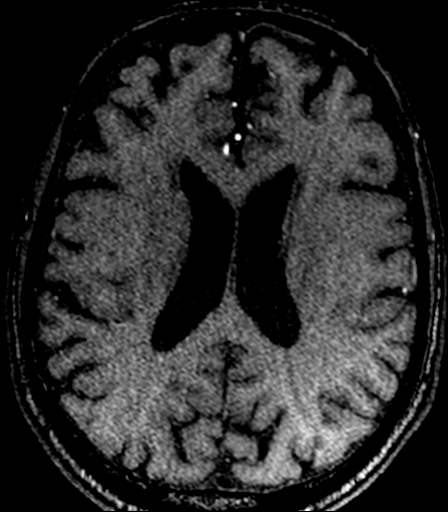

[23 of 48 positions shown; findings below may reference images not displayed]

FINDINGS: MRI HEAD FINDINGS

Brain: No acute infarction, hemorrhage, hydrocephalus, extra-axial
collection or mass lesion. Chronic lacunar infarct at the right
thalamus. Mild chronic small vessel ischemia in the hemispheric
white matter. Brain volume is normal

Vascular: Normal flow voids

Skull and upper cervical spine: Normal marrow signal

Sinuses/Orbits: Negative

MRA HEAD FINDINGS

Anterior circulation: Motion artifact at the level of the proximal
MCAs. Based on source images no flow limiting stenosis or beading
suspected. No aneurysm or vascular malformation

Posterior circulation: Mild left vertebral artery dominance.
Vertebral and basilar arteries are smoothly contoured and widely
patent. No branch occlusion, beading, or aneurysm

Anatomic variants: None significant
IMPRESSION: Brain MRI:

1. No emergent or reversible finding.
2. Chronic lacune at the right thalamus. Mild chronic small vessel
ischemia in the hemispheric white matter.

Intracranial MRA.

Negative motion degraded study.

## 2024-07-05 ENCOUNTER — Encounter (HOSPITAL_COMMUNITY): Payer: Self-pay

## 2024-07-05 ENCOUNTER — Emergency Department (HOSPITAL_COMMUNITY)

## 2024-07-05 ENCOUNTER — Emergency Department (HOSPITAL_COMMUNITY)
Admission: EM | Admit: 2024-07-05 | Discharge: 2024-07-05 | Disposition: A | Discharge status: Home / Self Care | Attending: Emergency Medicine | Admitting: Emergency Medicine

## 2024-07-05 ENCOUNTER — Other Ambulatory Visit: Payer: Self-pay

## 2024-07-05 DIAGNOSIS — Z5329 Procedure and treatment not carried out because of patient's decision for other reasons: Secondary | ICD-10-CM | POA: Diagnosis not present

## 2024-07-05 DIAGNOSIS — J45909 Unspecified asthma, uncomplicated: Secondary | ICD-10-CM | POA: Insufficient documentation

## 2024-07-05 DIAGNOSIS — N189 Chronic kidney disease, unspecified: Secondary | ICD-10-CM | POA: Diagnosis not present

## 2024-07-05 DIAGNOSIS — Z79899 Other long term (current) drug therapy: Secondary | ICD-10-CM | POA: Insufficient documentation

## 2024-07-05 DIAGNOSIS — R519 Headache, unspecified: Secondary | ICD-10-CM | POA: Diagnosis not present

## 2024-07-05 DIAGNOSIS — I129 Hypertensive chronic kidney disease with stage 1 through stage 4 chronic kidney disease, or unspecified chronic kidney disease: Secondary | ICD-10-CM | POA: Diagnosis not present

## 2024-07-05 DIAGNOSIS — Z7902 Long term (current) use of antithrombotics/antiplatelets: Secondary | ICD-10-CM | POA: Insufficient documentation

## 2024-07-05 DIAGNOSIS — R479 Unspecified speech disturbances: Secondary | ICD-10-CM | POA: Diagnosis present

## 2024-07-05 DIAGNOSIS — Z8673 Personal history of transient ischemic attack (TIA), and cerebral infarction without residual deficits: Secondary | ICD-10-CM | POA: Insufficient documentation

## 2024-07-05 DIAGNOSIS — G459 Transient cerebral ischemic attack, unspecified: Secondary | ICD-10-CM

## 2024-07-05 DIAGNOSIS — R4701 Aphasia: Secondary | ICD-10-CM | POA: Diagnosis not present

## 2024-07-05 LAB — CBC
HCT: 41 % (ref 39.0–52.0)
Hemoglobin: 13.9 g/dL (ref 13.0–17.0)
MCH: 31.5 pg (ref 26.0–34.0)
MCHC: 33.9 g/dL (ref 30.0–36.0)
MCV: 93 fL (ref 80.0–100.0)
Platelets: 125 K/uL — ABNORMAL LOW (ref 150–400)
RBC: 4.41 MIL/uL (ref 4.22–5.81)
RDW: 12.8 % (ref 11.5–15.5)
WBC: 5.9 K/uL (ref 4.0–10.5)
nRBC: 0 % (ref 0.0–0.2)

## 2024-07-05 LAB — URINE DRUG SCREEN
Amphetamines: NEGATIVE
Barbiturates: NEGATIVE
Benzodiazepines: NEGATIVE
Cocaine: NEGATIVE
Fentanyl: NEGATIVE
Methadone Scn, Ur: NEGATIVE
Opiates: NEGATIVE
Tetrahydrocannabinol: NEGATIVE

## 2024-07-05 LAB — I-STAT CHEM 8, ED
BUN: 17 mg/dL (ref 8–23)
Calcium, Ion: 1.13 mmol/L — ABNORMAL LOW (ref 1.15–1.40)
Chloride: 102 mmol/L (ref 98–111)
Creatinine, Ser: 1.1 mg/dL (ref 0.61–1.24)
Glucose, Bld: 95 mg/dL (ref 70–99)
HCT: 42 % (ref 39.0–52.0)
Hemoglobin: 14.3 g/dL (ref 13.0–17.0)
Potassium: 4 mmol/L (ref 3.5–5.1)
Sodium: 141 mmol/L (ref 135–145)
TCO2: 30 mmol/L (ref 22–32)

## 2024-07-05 LAB — COMPREHENSIVE METABOLIC PANEL WITH GFR
ALT: 43 U/L (ref 0–44)
AST: 40 U/L (ref 15–41)
Albumin: 4.1 g/dL (ref 3.5–5.0)
Alkaline Phosphatase: 89 U/L (ref 38–126)
Anion gap: 9 (ref 5–15)
BUN: 15 mg/dL (ref 8–23)
CO2: 28 mmol/L (ref 22–32)
Calcium: 9.7 mg/dL (ref 8.9–10.3)
Chloride: 103 mmol/L (ref 98–111)
Creatinine, Ser: 0.96 mg/dL (ref 0.61–1.24)
GFR, Estimated: >60 mL/min (ref >60)
Glucose, Bld: 94 mg/dL (ref 70–99)
Potassium: 4.2 mmol/L (ref 3.5–5.1)
Sodium: 140 mmol/L (ref 135–145)
Total Bilirubin: 0.4 mg/dL (ref 0.0–1.2)
Total Protein: 6.8 g/dL (ref 6.5–8.1)

## 2024-07-05 LAB — PROTIME-INR
INR: 1 (ref 0.8–1.2)
Prothrombin Time: 13.6 s (ref 11.4–15.2)

## 2024-07-05 LAB — DIFFERENTIAL
Abs Immature Granulocytes: 0.01 K/uL (ref 0.00–0.07)
Basophils Absolute: 0 K/uL (ref 0.0–0.1)
Basophils Relative: 1 %
Eosinophils Absolute: 0 K/uL (ref 0.0–0.5)
Eosinophils Relative: 1 %
Immature Granulocytes: 0 %
Lymphocytes Relative: 30 %
Lymphs Abs: 1.7 K/uL (ref 0.7–4.0)
Monocytes Absolute: 0.4 K/uL (ref 0.1–1.0)
Monocytes Relative: 6 %
Neutro Abs: 3.7 K/uL (ref 1.7–7.7)
Neutrophils Relative %: 62 %

## 2024-07-05 LAB — CBG MONITORING, ED: Glucose-Capillary: 105 mg/dL — ABNORMAL HIGH (ref 70–99)

## 2024-07-05 LAB — ETHANOL: Alcohol, Ethyl (B): <15 mg/dL (ref <15)

## 2024-07-05 LAB — APTT: aPTT: 31 s (ref 24–36)

## 2024-07-05 MED ORDER — CLOPIDOGREL BISULFATE 75 MG PO TABS: 75.0000 mg | ORAL_TABLET | Freq: Every day | ORAL | 2 refills | Status: DC

## 2024-07-05 MED ORDER — ASPIRIN 81 MG PO CHEW
324.0000 mg | CHEWABLE_TABLET | Freq: Once | ORAL | Status: AC
  Administered 2024-07-05: 16:00:00 324 mg via ORAL
  Filled 2024-07-05: qty 4

## 2024-07-05 MED ORDER — IOHEXOL 350 MG/ML SOLN
75.0000 mL | Freq: Once | INTRAVENOUS | Status: AC | PRN
  Administered 2024-07-05: 15:00:00 75 mL via INTRAVENOUS

## 2024-07-05 MED ORDER — ASPIRIN 81 MG PO CHEW: 81.0000 mg | CHEWABLE_TABLET | Freq: Every day | ORAL | 2 refills | Status: AC

## 2024-07-05 MED ORDER — CLOPIDOGREL BISULFATE 300 MG PO TABS
300.0000 mg | ORAL_TABLET | ORAL | Status: AC
  Administered 2024-07-05: 16:00:00 300 mg via ORAL
  Filled 2024-07-05: qty 1

## 2024-07-05 NOTE — ED Notes (Signed)
 Patient transported to MRI

## 2024-07-05 NOTE — Discharge Instructions (Addendum)
 You were seen for your TIA (transient ischemic attack) in the emergency department.   At home, please take the aspirin  and Plavix  that we have prescribed you.  Do not take your blood pressure medicine for 48 hours.  Check your MyChart online for the results of any tests that had not resulted by the time you left the emergency department.   Follow-up with your primary doctor in 2-3 days regarding your visit.  Follow-up with neurology as soon as possible.  Please have your primary doctor or neurologist check your cholesterol to see if you need to be started on a statin.  Return immediately to the emergency department if you experience any of the following: New numbness or weakness, difficulty speaking, vision changes, severe headache, or any other concerning symptoms.    Thank you for visiting our Emergency Department. It was a pleasure taking care of you today.

## 2024-07-05 NOTE — ED Notes (Signed)
 Called to activate code stroke

## 2024-07-05 NOTE — Consult Note (Signed)
 NEUROLOGY CONSULT NOTE   Date of service: July 05, 2024 Patient Name: Dustin Nielsen MRN:  [id number] DOB:  [birth year] Chief Complaint: word-finding difficulty Requesting Provider: Yolande Lamar BROCKS, MD  History of Present Illness  Dustin Nielsen is a 78 y.o. male with hx of stroke 2017 with residual left arm numbness, HTN, CKD who presented to ED with word-finding difficulties. CODE STROKE was activated by EDP due to headache and aphasia.   On exam, patient was awake and alert, able to get up from [facility] and ambulate to CT scanner with no help, no aphasia or dysarthria noted, no focal deficits, sensory deficit to left arm (which is chronic per patient from prior CVA). Daughter at bedside confirmed that patient's speech was his normal cadence/character.   CTH, CTA negative.  Prior history of similar recall episode a few years ago for which she did not seek medical help. LKW: 1330 Modified rankin score: 0-Completely asymptomatic and back to baseline post- stroke IV Thrombolysis: No, improving symptoms EVT: No, no LVO   NIHSS components Score: Comment  1a Level of Conscious 0[x]  1[]  2[]  3[]      1b LOC Questions 0[x]  1[]  2[]       1c LOC Commands 0[x]  1[]  2[]       2 Best Gaze 0[x]  1[]  2[]       3 Visual 0[x]  1[]  2[]  3[]      4 Facial Palsy 0[x]  1[]  2[]  3[]      5a Motor Arm - left 0[x]  1[]  2[]  3[]  4[]  UN[]    5b Motor Arm - Right 0[x]  1[]  2[]  3[]  4[]  UN[]    6a Motor Leg - Left 0[x]  1[]  2[]  3[]  4[]  UN[]    6b Motor Leg - Right 0[x]  1[]  2[]  3[]  4[]  UN[]    7 Limb Ataxia 0[x]  1[]  2[]  UN[]      8 Sensory 0[]  1[x]  2[]  UN[]     LUE numbness from prior CVA  9 Best Language 0[x]  1[]  2[]  3[]      10 Dysarthria 0[x]  1[]  2[]  UN[]      11 Extinct. and Inattention 0[x]  1[]  2[]       TOTAL: 1      ROS  Comprehensive ROS performed and pertinent positives documented in HPI   Past History   Past Medical History:  Diagnosis Date   Allergy    Asthma    Chicken pox    Chronic kidney disease     stones   Hypertension    Stroke (HCC) 08-03-15   Left forearm and hand paresthesias    Past Surgical History:  Procedure Laterality Date   CHOLECYSTECTOMY      Family History: Family History  Problem Relation Age of Onset   Kidney disease Mother 70       deceased   Stroke Father    Hypertension Father    Heart disease Father 1       MI    Social History  reports that he quit smoking about 50 years ago. His smoking use included cigarettes. He started smoking about 65 years ago. He has a 15 pack-year smoking history. He has never used smokeless tobacco. He reports that he does not drink alcohol and does not use drugs.  Allergies[1]  Medications  Current Medications[2]  Vitals   Vitals:   07/05/24 1454 07/05/24 1500  BP: (!) 223/90   Pulse: 60   Resp: 18   Temp: 97.6 F (36.4 C)   SpO2: 99%   Weight:  70.4 kg    Body mass index is 22.92  kg/m.   Physical Exam   Constitutional: Appears well-developed and well-nourished.  Cardiovascular: Normal rate and regular rhythm. Hypertensive.  Respiratory: Effort normal, non-labored breathing.   Neurologic Examination   Neuro: Mental Status: Patient is awake, alert, oriented to person, place, month, year, and situation. Patient is able to give a clear and coherent history. No signs of aphasia or neglect Cranial Nerves: II: Visual Fields are full. Pupils are equal, round, and reactive to light.   III,IV, VI: EOMI without ptosis or diploplia.  V: Facial sensation is symmetric to light touch VII: Facial movement is symmetric.  VIII: hearing is intact to voice X: Uvula elevates symmetrically. No dysarthria. XI: Shoulder shrug is symmetric. XII: tongue is midline without atrophy or fasciculations.  Motor: Tone is normal. Bulk is normal. 5/5 strength was present in all four extremities.  Sensory: Sensation is decreased in left arm (chronic) Cerebellar: FNF and HKS are intact  bilaterally   Labs/Imaging/Neurodiagnostic studies   CBC:  Recent Labs  Lab 2024/07/24 1504  HGB 14.3  HCT 42.0   Basic Metabolic Panel:  Lab Results  Component Value Date   NA 141 07/24/24   K 4.0 07-24-2024   CO2 35 (H) 11/12/2016   GLUCOSE 95 07-24-2024   BUN 17 Jul 24, 2024   CREATININE 1.10 2024-07-24   CALCIUM  10.3 11/12/2016   Lipid Panel:  Lab Results  Component Value Date   LDLCALC 101 (H) 11/12/2016   HgbA1c: No results found for: HGBA1C Urine Drug Screen: No results found for: LABOPIA, COCAINSCRNUR, LABBENZ, AMPHETMU, THCU, LABBARB  Alcohol Level No results found for: ETH INR No results found for: INR APTT No results found for: APTT AED levels: No results found for: PHENYTOIN, ZONISAMIDE, LAMOTRIGINE, LEVETIRACETA  CT Head without contrast(Personally reviewed): Negative  CT angio Head and Neck with contrast(Personally reviewed): No LVO.  No stenosis  MRI Brain(Personally reviewed): ordered  ASSESSMENT   Mildred Nielsen is a 78 y.o. male with transient episode of expressive aphasia likely left hemispheric TIA etiology to be determined but likely embolic  Likely left hemispheric stroke with transient expressive aphasia  RECOMMENDATIONS   - Frequent Neuro checks per stroke unit protocol - MRI Brain stroke protocol  - TTE - Lipid panel - Statin - will be started if LDL>70 or otherwise medically indicated - A1C - Antithrombotic -Plavix  300 mg load followed by 75 mg daily into 3 weeks and.  Aspirin  325 mg now and then 1 mg daily starting tomorrow long-term - DVT ppx -not needed - Smoking cessation - will counsel patient - SBP goal - <220, PRN labetalol if HR>60 and PRN Hydralazine if HR<60 - Telemetry monitoring for arrhythmia -  -Outpatient 30-day heart monitor for paroxysmal A-fib - Swallow screen - will be performed prior to PO intake - Stroke education - will be given - PT/OT/SLP - Dispo: admit for TIA/stroke  workup  ______________________________________________________________________    Signed, Rocky JAYSON Likes, NP [facility]   I have personally obtained history,examined this patient, reviewed notes, independently viewed imaging studies, participated in medical decision making and plan of care.ROS completed by me personally and pertinent positives fully documented  I have made any additions or clarifications directly to the above note. Agree with note above.  78 year old Hispanic male with 20-minute episode of transient expressive aphasia which appears to have resolved likely left hemispheric TIA.  Prior history of somewhat similar but milder episode of years ago for which he did not get evaluation.  Recommend admission for TIA workup.  Check  MRI and echocardiogram with frequent neuropathy.  Recommend dual antiplatelet therapy of aspirin  and Plavix  for 3 weeks followed by aspirin  on life and aggressive risk factor modification Telemetry monitoring overnight and patient will need outpatient continued heart monitor for paroxysmal A-fib. Longdiscussion with patient and daughter and answered questions.  Discussed with Dr. Jakie ER MD.   I personally spent a total of 55 minutes in the care of the patient today including getting/reviewing separately obtained history, performing a medically appropriate exam/evaluation, counseling and educating, placing orders, referring and communicating with other health care professionals, documenting clinical information in the EHR, independently interpreting results, and coordinating care.        Eather Popp, MD Medical Director [facility] Pager: [phone] 07/05/2024 4:55 PM     [1] No Known Allergies [2] No current facility-administered medications for this encounter.  Current Outpatient Medications:    amLODipine  (NORVASC ) 5 MG tablet, Take 1 tablet (5 mg total) by mouth daily., Disp: 90 tablet, Rfl: 3   clopidogrel  (PLAVIX )  75 MG tablet, Take 75 mg by mouth daily., Disp: , Rfl:    valsartan -hydrochlorothiazide  (DIOVAN -HCT) 160-25 MG tablet, Take 1 tablet by mouth daily., Disp: 90 tablet, Rfl: 3   valsartan -hydrochlorothiazide  (DIOVAN -HCT) 160-25 MG tablet, TAKE 1 TABLET BY MOUTH DAILY, Disp: 90 tablet, Rfl: 0

## 2024-07-05 NOTE — ED Triage Notes (Signed)
 C/O aphasia/minimal headache. LSN 1430. Denies numbness/tingling/weakness. Pt ambulatory axox4.

## 2024-07-05 NOTE — Code Documentation (Signed)
 Stroke Response Nurse Documentation Code Documentation  Dustin Nielsen is a 78 y.o. male arriving to [place]  via Private Vehicle on 07/05/2024 with past medical hx of prior CVA with left sided sensory loss. On No antithrombotic. Code stroke was activated by ED.   [facility] from [facility] where he was LKW at 1330 and now complaining of headache and word finding problems.   Stroke team at the bedside on patient arrival. Labs drawn and patient cleared for CT by Dr. Jakie. Patient to CT with team. NIHSS 1, see documentation for details and code stroke times. Patient with left decreased sensation on exam. The following imaging was completed:  CT Head and CTA. Patient is not a candidate for IV Thrombolytic due to resolution of symptoms. Patient is not a candidate for IR due to no LVO seen on advanced imaging.   Care Plan:    No acute treatment/TIA alert: q2h x 12 hours NIHSS & VS, then q4h NPO until stroke swallow screen complete  In window monitoring: q74min NIHSS and VS  Bedside handoff with ED RN Augusta Said, Richardson Colt  Stroke Response RN

## 2024-07-05 NOTE — Progress Notes (Signed)
 Echocardiogram Attempted exam at 520pm; patient not in room.  Dustin Nielsen 07/05/2024, 5:19 PM

## 2024-07-05 NOTE — ED Provider Notes (Signed)
 Dustin Nielsen EMERGENCY DEPARTMENT AT [facility] Provider Note   CSN: [id number] Arrival date & time: 07/05/24  1446     Patient presents with: stroke like sx   Dustin Nielsen is a 78 y.o. male.  {Add pertinent medical, surgical, social history, OB history to HPI:6039} 78 year old male history of TIA and hypertension who presents emergency department difficulty speaking.  Patient reports that he was working out and started developing a headache on top of his head.  Got home and his daughter noticed that he had some difficulty speaking.  She did not notice any facial weakness or arm or leg weakness.  Does have a history of TIA.  On Plavix  but no other blood thinners.  Has had difficulty speaking in the past with his TIAs.  Per daughter last known well was 1:30 PM.        Prior to Admission medications  Medication Sig Start Date End Date Taking? Authorizing Provider  amLODipine  (NORVASC ) 5 MG tablet Take 1 tablet (5 mg total) by mouth daily. 11/06/15   Burchette, Wolm ORN, MD  clopidogrel  (PLAVIX ) 75 MG tablet Take 75 mg by mouth daily. 02/01/21   [provider]  valsartan -hydrochlorothiazide  (DIOVAN -HCT) 160-25 MG tablet Take 1 tablet by mouth daily. 11/12/16   Burchette, Wolm ORN, MD  valsartan -hydrochlorothiazide  (DIOVAN -HCT) 160-25 MG tablet TAKE 1 TABLET BY MOUTH DAILY 08/26/17   Burchette, Wolm ORN, MD  montelukast  (SINGULAIR ) 10 MG tablet Take 1 tablet (10 mg total) by mouth at bedtime. 12/06/10 10/02/11  Burchette, Wolm ORN, MD    Allergies: Patient has no known allergies.    Review of Systems  Updated Vital Signs BP (!) 223/90   Pulse 60   Temp 97.6 F (36.4 C)   Resp 18   Wt 70.4 kg Comment: code stroke  SpO2 99%   BMI 22.92 kg/m   Physical Exam Constitutional:      General: He is not in acute distress.    Appearance: Normal appearance. He is not ill-appearing.  Neurological:     Mental Status: He is alert.     Comments: NIHSS Exam  Level of  Consciousness: Alert  LOC Questions: Answers Month and Age Correctly  LOC Commands: Opens and Closes Eyes and Hands on command  Best Gaze: Horizontal ocular movements intact  Visual Fields: No visual field loss  Facial Palsy: None  L Upper Extremity Motor: No drift after 10 seconds  R Upper Extremity Motor: No drift after 10 seconds  L Lower extremity Motor: No drift after 5 seconds  R Lower extremity Motor: No drift after 5 seconds  Ataxia: Absent  Sensory: Diminished sensation light touch on left face, arm, and leg which is chronic Best Language: No aphasia  Dysarthria: No dysarthria  Neglect: No visual or sensory neglect        (all labs ordered are listed, but only abnormal results are displayed) Labs Reviewed  CBG MONITORING, ED - Abnormal; Notable for the following components:      Result Value   Glucose-Capillary 105 (*)    All other components within normal limits  I-STAT CHEM 8, ED - Abnormal; Notable for the following components:   Calcium , Ion 1.13 (*)    All other components within normal limits  PROTIME-INR  APTT  CBC  DIFFERENTIAL  COMPREHENSIVE METABOLIC PANEL WITH GFR  ETHANOL  URINE DRUG SCREEN    EKG: None  Radiology: No results found.  {Document cardiac monitor, telemetry assessment procedure when appropriate:32947} Procedures  Medications Ordered in the ED - No data to display    {Click here for ABCD2, HEART and other calculators REFRESH Note before signing:1}                              Medical Decision Making Amount and/or Complexity of Data Reviewed Labs: ordered. Radiology: ordered.  Risk OTC drugs. Prescription drug management. Decision regarding hospitalization.   Dustin Nielsen is a 78 year old male history of TIA and hypertension who presents emergency department difficulty speaking.    Initial Ddx:  Stroke, ICH, TIA, hypoglycemia  MDM/Course:  Patient resents emergency department difficulty speaking since 1:30 PM  today.also is reporting a headache that started prior to arrival.  On arrival only has left-sided diminished sensation light touch which she reports is chronic.  No other new neurologic deficits.  Hypertensive to 223/90.  Head CT without hemorrhage.  Since patient's symptoms have resolved decision was made not to give TNK.  Did have serial NIH is without any new symptoms upon re-evaluation.  Remained hypertensive but did not require any blood pressure medication.  MRI did not show acute stroke.  Recommended that the patient be admitted for TIA evaluation but patient is requesting to go home AGAINST MEDICAL ADVICE at this point in time.  Had an extensive discussion with him and his daughter who is at the bedside and they are aware that the patient could suffer life-threatening or permanently disabling stroke but would like to go home regardless.  Does have capacity to make this decision.  Will have him continue aspirin  and Plavix  at home and hold his blood pressure medicine for the next 48 hours.  Will need to follow-up to have his cholesterol tested to see if he needs to start a statin. ***  This patient presents to the ED for concern of complaints listed in HPI, this involves an extensive number of treatment options, and is a complaint that carries with it a high risk of complications and morbidity. Disposition including potential need for admission considered.   Dispo: AMA  I have reviewed the patients home medications and made adjustments as needed Additional history obtained from {Additional History:28067} Records reviewed {Records Reviewed:28068} The following labs were independently interpreted: {labs interpreted:28064} and show {lab findings:28250} I independently reviewed the following imaging with scope of interpretation limited to determining acute life threatening conditions related to emergency care: {imaging interpreted:28065} and agree with the radiologist interpretation with the following  exceptions: none I personally reviewed and interpreted cardiac monitoring: {cardiac monitoring:28251} I personally reviewed and interpreted the pt's EKG: see above for interpretation  Consults: {Consultants:28063} Social Determinants of health:  ***  Portions of this note were generated with Scientist, clinical (histocompatibility and immunogenetics). Dictation errors may occur despite best attempts at proofreading.     Final diagnoses:  None    ED Discharge Orders     None

## 2024-07-05 NOTE — ED Notes (Signed)
 Patient ambulated to the restroom with standby assistance.

## 2024-07-07 ENCOUNTER — Encounter: Payer: Self-pay | Admitting: Neurology

## 2024-08-06 ENCOUNTER — Other Ambulatory Visit: Payer: Self-pay

## 2024-08-06 ENCOUNTER — Encounter (HOSPITAL_COMMUNITY): Payer: Self-pay

## 2024-08-06 ENCOUNTER — Emergency Department (HOSPITAL_COMMUNITY)

## 2024-08-06 ENCOUNTER — Emergency Department (HOSPITAL_COMMUNITY)
Admission: EM | Admit: 2024-08-06 | Discharge: 2024-08-06 | Disposition: A | Discharge status: Home / Self Care | Attending: Emergency Medicine | Admitting: Emergency Medicine

## 2024-08-06 DIAGNOSIS — Z7901 Long term (current) use of anticoagulants: Secondary | ICD-10-CM | POA: Diagnosis not present

## 2024-08-06 DIAGNOSIS — R519 Headache, unspecified: Secondary | ICD-10-CM | POA: Diagnosis present

## 2024-08-06 DIAGNOSIS — I10 Essential (primary) hypertension: Secondary | ICD-10-CM | POA: Diagnosis not present

## 2024-08-06 DIAGNOSIS — Z7982 Long term (current) use of aspirin: Secondary | ICD-10-CM | POA: Diagnosis not present

## 2024-08-06 LAB — COMPREHENSIVE METABOLIC PANEL WITH GFR
ALT: 22 U/L (ref 0–44)
AST: 31 U/L (ref 15–41)
Albumin: 4.2 g/dL (ref 3.5–5.0)
Alkaline Phosphatase: 86 U/L (ref 38–126)
Anion gap: 10 (ref 5–15)
BUN: 19 mg/dL (ref 8–23)
CO2: 27 mmol/L (ref 22–32)
Calcium: 9.7 mg/dL (ref 8.9–10.3)
Chloride: 99 mmol/L (ref 98–111)
Creatinine, Ser: 0.92 mg/dL (ref 0.61–1.24)
GFR, Estimated: >60 mL/min
Glucose, Bld: 101 mg/dL — ABNORMAL HIGH (ref 70–99)
Potassium: 4.3 mmol/L (ref 3.5–5.1)
Sodium: 136 mmol/L (ref 135–145)
Total Bilirubin: 0.4 mg/dL (ref 0.0–1.2)
Total Protein: 7 g/dL (ref 6.5–8.1)

## 2024-08-06 LAB — CBC WITH DIFFERENTIAL/PLATELET
Abs Immature Granulocytes: 0.02 K/uL (ref 0.00–0.07)
Basophils Absolute: 0.1 K/uL (ref 0.0–0.1)
Basophils Relative: 1 %
Eosinophils Absolute: 0 K/uL (ref 0.0–0.5)
Eosinophils Relative: 0 %
HCT: 40.5 % (ref 39.0–52.0)
Hemoglobin: 13.7 g/dL (ref 13.0–17.0)
Immature Granulocytes: 0 %
Lymphocytes Relative: 16 %
Lymphs Abs: 1.1 K/uL (ref 0.7–4.0)
MCH: 31.7 pg (ref 26.0–34.0)
MCHC: 33.8 g/dL (ref 30.0–36.0)
MCV: 93.8 fL (ref 80.0–100.0)
Monocytes Absolute: 0.3 K/uL (ref 0.1–1.0)
Monocytes Relative: 4 %
Neutro Abs: 5.3 K/uL (ref 1.7–7.7)
Neutrophils Relative %: 79 %
Platelets: 150 K/uL (ref 150–400)
RBC: 4.32 MIL/uL (ref 4.22–5.81)
RDW: 12.7 % (ref 11.5–15.5)
WBC: 6.8 K/uL (ref 4.0–10.5)
nRBC: 0 % (ref 0.0–0.2)

## 2024-08-06 MED ORDER — ACETAMINOPHEN 325 MG PO TABS
650.0000 mg | ORAL_TABLET | Freq: Once | ORAL | Status: AC
  Administered 2024-08-06: 650 mg via ORAL
  Filled 2024-08-06: qty 2

## 2024-08-06 MED ORDER — HYDRALAZINE HCL 25 MG PO TABS
25.0000 mg | ORAL_TABLET | Freq: Once | ORAL | Status: AC
  Administered 2024-08-06: 25 mg via ORAL
  Filled 2024-08-06: qty 1

## 2024-08-06 MED ORDER — METOCLOPRAMIDE HCL 5 MG/ML IJ SOLN
10.0000 mg | Freq: Once | INTRAMUSCULAR | Status: AC
  Administered 2024-08-06: 10 mg via INTRAMUSCULAR
  Filled 2024-08-06: qty 2

## 2024-08-06 MED ORDER — HYDRALAZINE HCL 25 MG PO TABS: 25.0000 mg | ORAL_TABLET | Freq: Two times a day (BID) | ORAL | 0 refills | Status: DC | PRN

## 2024-08-06 NOTE — Discharge Instructions (Addendum)
 Thank you for the opportunity to take care of you in our Emergency Department.  CT scan of your brain is normal.  Blood work is normal.  We recommend that you take hydralazine  25 mg as needed if your BP is over 180 in the afternoon or evening.  Continue to keep a log of the blood pressure.  Read the instructions provided.  Follow-up with your neurologist and PCP as planned.  Return to the ER if you have any neurologic symptoms such as one-sided weakness, numbness, slurred speech, severe headaches with nausea and vomiting, chest pain.  You have been diagnosed with high blood pressure, also known as hypertension. This means that the force of blood against the walls of your blood vessels called is too strong. It also means that your heart has to work harder to move the blood. High blood pressure usually has no symptoms, but over time, it can cause serious health problems such as Heart attack and heart failure Stroke Kidney disease and failure Vision loss With the help from your healthcare provider and some important life style changes, you can manage your blood pressure and protect your health. Please read the instructions provided on hypertension, how to manage it and how to check your blood pressure. Additionally, use the blood pressure log provided to record your blood pressures. Take the blood pressure log with you to your primary care doctor so that they can adjust your blood pressure medications if needed. Please read the instructions on follow-up appointment. Return to the ER or Call 911 right away if you have any of these symptoms: Chest pain or shortness of breath Severe headache Weakness, tingling, or numbness of your face, arms, or legs (especially on 1 side of the body) Sudden change in vision Confusion, trouble speaking, or trouble understanding speech

## 2024-08-06 NOTE — ED Triage Notes (Signed)
 Pt states having HTN and c/o headache at 1200. Family states BP was 215 systolic. Denies numbness/tingling/ blurred vision. Denies CP and SHOB.

## 2024-08-06 NOTE — ED Notes (Signed)
 Pt provided with discharge and follow up instructions, medications discussed, pt verbalized understanding. VSS, pt ambulatory out of ED w/ all paperwork and belongings in NAD.

## 2024-08-06 NOTE — ED Provider Triage Note (Signed)
 Emergency Medicine Provider Triage Evaluation Note  Dustin Nielsen , a 79 y.o. male  was evaluated in triage.  Pt who presents to the emergency department with a chief complaint of headache starting at 12pm today. Hx of TIA. No neurologic symptoms, no visual disturbances. Hypertensive with systolic over 200 at time of headache.   Review of Systems  Positive: headache Negative: Vision changes, facial droop, slurred speech, extremity weakness  Physical Exam  BP (!) 188/115   Pulse 65   Temp 98.3 F (36.8 C)   Resp 18   Ht 5' 9 (1.753 m)   Wt 68 kg   SpO2 95%   BMI 22.15 kg/m  Gen:   Awake, no distress   Resp:  Normal effort  MSK:   Moves extremities without difficulty  Other:    Medical Decision Making  Medically screening exam initiated at 4:13 PM.  Appropriate orders placed.  Dustin Nielsen was informed that the remainder of the evaluation will be completed by another provider, this initial triage assessment does not replace that evaluation, and the importance of remaining in the ED until their evaluation is complete.  Orders: CBC, CMP, CT head   Dustin Terrall FALCON, PA-C 08/06/24 1620

## 2024-08-06 NOTE — ED Provider Notes (Signed)
 " [place] EMERGENCY DEPARTMENT AT [facility] Provider Note   CSN: [id number] Arrival date & time: 08/06/24  1538     Patient presents with: No chief complaint on file.   Dustin Nielsen is a 79 y.o. male.   HPI    78 year old male comes in with chief complaint of hypertension. Patient is accompanied by his children.  According to the family, patient had a TIA in December.  Ever since then his BP has been elevated.  Prior to that his BP was running low.  Patient was on 80 mg of valsartan .  It was doubled to 160 mg.  Despite the change, the BP is only come down slightly.  Today patient started having headache over the vertex at noon.  The headache is described as throbbing pain.  He had no associated numbness, tingling, weakness, slurred speech or vision changes.  Patient's blood pressure was checked around the same time, and it was over 200, which prompted patient's family to bring him to the ER.  Patient is taking aspirin  and Plavix .  He is compliant with all his medications.  He is very active in general.  Patient is expecting a follow-up appointment with neurology and then PCP soon.  The goal was to adjust BP medications after neurology evaluation.  Family has been keeping a log of the blood pressure, and mostly the blood pressure has been running over 160.  Prior to Admission medications  Medication Sig Start Date End Date Taking? Authorizing Provider  hydrALAZINE  (APRESOLINE ) 25 MG tablet Take 1 tablet (25 mg total) by mouth 2 (two) times daily as needed (Systolic blood pressure > 180). 08/06/24  Yes Charlyn Sora, MD  amLODipine  (NORVASC ) 5 MG tablet Take 1 tablet (5 mg total) by mouth daily. 11/06/15   Burchette, Wolm ORN, MD  aspirin  81 MG chewable tablet Chew 1 tablet (81 mg total) by mouth daily. 07/05/24   Yolande Lamar BROCKS, MD  clopidogrel  (PLAVIX ) 75 MG tablet Take 1 tablet (75 mg total) by mouth daily. 07/05/24   Yolande Lamar BROCKS, MD   valsartan -hydrochlorothiazide  (DIOVAN -HCT) 160-25 MG tablet Take 1 tablet by mouth daily. 11/12/16   Burchette, Wolm ORN, MD  valsartan -hydrochlorothiazide  (DIOVAN -HCT) 160-25 MG tablet TAKE 1 TABLET BY MOUTH DAILY 08/26/17   Burchette, Wolm ORN, MD  montelukast  (SINGULAIR ) 10 MG tablet Take 1 tablet (10 mg total) by mouth at bedtime. 12/06/10 10/02/11  Burchette, Wolm ORN, MD    Allergies: Patient has no known allergies.    Review of Systems  All other systems reviewed and are negative.   Updated Vital Signs BP (!) 171/90   Pulse 77   Temp 98.3 F (36.8 C)   Resp 15   Ht 5' 9 (1.753 m)   Wt 68 kg   SpO2 100%   BMI 22.15 kg/m   Physical Exam Vitals and nursing note reviewed.  Constitutional:      Appearance: He is well-developed.  HENT:     Head: Atraumatic.  Eyes:     Extraocular Movements: Extraocular movements intact.     Pupils: Pupils are equal, round, and reactive to light.  Cardiovascular:     Rate and Rhythm: Normal rate.  Pulmonary:     Effort: Pulmonary effort is normal.  Musculoskeletal:     Cervical back: Neck supple.  Skin:    General: Skin is warm.  Neurological:     Mental Status: He is alert and oriented to person, place, and time.     Cranial  Nerves: No cranial nerve deficit.     Sensory: No sensory deficit.     Motor: No weakness.     Coordination: Coordination normal.     (all labs ordered are listed, but only abnormal results are displayed) Labs Reviewed  COMPREHENSIVE METABOLIC PANEL WITH GFR - Abnormal; Notable for the following components:      Result Value   Glucose, Bld 101 (*)    All other components within normal limits  CBC WITH DIFFERENTIAL/PLATELET    EKG: None  Radiology: CT Head Wo Contrast Result Date: 08/06/2024 EXAM: CT HEAD WITHOUT CONTRAST 08/06/2024 04:28:49 PM TECHNIQUE: CT of the head was performed without the administration of intravenous contrast. Automated exposure control, iterative reconstruction, and/or weight  based adjustment of the mA/kV was utilized to reduce the radiation dose to as low as reasonably achievable. COMPARISON: MRI of the head without contrast 07/05/2024 and a CT of the head without contrast 07/05/2024. CLINICAL HISTORY: headache, hypertension FINDINGS: BRAIN AND VENTRICLES: No acute hemorrhage. No evidence of acute infarct. Periventricular and scattered subcortical white matter hypoattenuation bilaterally is stable, mildly advanced for age. Chronic right thalamic lacunar infarction is stable. Atherosclerotic calcifications within the cavernous internal carotid arteries. No hydrocephalus. No extra-axial collection. No mass effect or midline shift. ORBITS: No acute abnormality. SINUSES: No acute abnormality. SOFT TISSUES AND SKULL: No acute soft tissue abnormality. No skull fracture. IMPRESSION: 1. No acute intracranial abnormality. Electronically signed by: Lonni Necessary MD 08/06/2024 04:56 PM EST RP Workstation: [id number]     Procedures   Medications Ordered in the ED  hydrALAZINE  (APRESOLINE ) tablet 25 mg (25 mg Oral Given 08/06/24 1918)  metoCLOPramide  (REGLAN ) injection 10 mg (10 mg Intramuscular Given 08/06/24 1918)  acetaminophen  (TYLENOL ) tablet 650 mg (650 mg Oral Given 08/06/24 1918)                                    Medical Decision Making Risk OTC drugs. Prescription drug management.   79 year old male comes in with chief complaint of elevated blood pressure, headache.  He has history of TIA.  I reviewed patient's recent admission note, neurology note and also his medications.  Collateral history also provided by patient's family.  In the ER, patient reports moderate headache.  He had a CT head upon arrival.  CT head was completed within 6 hours of the onset of the headache and is negative for acute bleed.  I have independently interpreted CT head, it is negative for bleed.  Patient has nonfocal neuroexam at this time.  His BP has improved on its own.  Currently  the BP is just over 180.   After long discussion with the family, plan is to put him on as needed hydralazine .  He will take this only in the afternoon and night as needed for systolic blood pressure over 180.  Family will continue to keep the log of the blood pressure and discuss the hypertension with the neurologist and PCP in the upcoming appointments.  Strict return precautions have been discussed with the patient and family.  They will return to the ER if there is any acute neurologic symptoms or severe chest pain, shortness of breath.   Final diagnoses:  Uncontrolled hypertension    ED Discharge Orders          Ordered    hydrALAZINE  (APRESOLINE ) 25 MG tablet  2 times daily PRN        08/06/24  8067               Charlyn Sora, MD 08/06/24 1936  "

## 2024-08-08 ENCOUNTER — Emergency Department (HOSPITAL_COMMUNITY)
Admission: EM | Admit: 2024-08-08 | Discharge: 2024-08-08 | Disposition: A | Discharge status: Home / Self Care | Attending: Emergency Medicine | Admitting: Emergency Medicine

## 2024-08-08 ENCOUNTER — Other Ambulatory Visit: Payer: Self-pay

## 2024-08-08 ENCOUNTER — Encounter (HOSPITAL_COMMUNITY): Payer: Self-pay

## 2024-08-08 DIAGNOSIS — I10 Essential (primary) hypertension: Secondary | ICD-10-CM | POA: Diagnosis not present

## 2024-08-08 DIAGNOSIS — Z7902 Long term (current) use of antithrombotics/antiplatelets: Secondary | ICD-10-CM | POA: Diagnosis not present

## 2024-08-08 DIAGNOSIS — Z7982 Long term (current) use of aspirin: Secondary | ICD-10-CM | POA: Diagnosis not present

## 2024-08-08 DIAGNOSIS — Z79899 Other long term (current) drug therapy: Secondary | ICD-10-CM | POA: Diagnosis not present

## 2024-08-08 DIAGNOSIS — R519 Headache, unspecified: Secondary | ICD-10-CM | POA: Diagnosis present

## 2024-08-08 LAB — CBC WITH DIFFERENTIAL/PLATELET
Abs Immature Granulocytes: 0.03 K/uL (ref 0.00–0.07)
Basophils Absolute: 0.1 K/uL (ref 0.0–0.1)
Basophils Relative: 1 %
Eosinophils Absolute: 0.1 K/uL (ref 0.0–0.5)
Eosinophils Relative: 1 %
HCT: 40.6 % (ref 39.0–52.0)
Hemoglobin: 13.8 g/dL (ref 13.0–17.0)
Immature Granulocytes: 0 %
Lymphocytes Relative: 19 %
Lymphs Abs: 1.5 K/uL (ref 0.7–4.0)
MCH: 32 pg (ref 26.0–34.0)
MCHC: 34 g/dL (ref 30.0–36.0)
MCV: 94.2 fL (ref 80.0–100.0)
Monocytes Absolute: 0.5 K/uL (ref 0.1–1.0)
Monocytes Relative: 6 %
Neutro Abs: 5.8 K/uL (ref 1.7–7.7)
Neutrophils Relative %: 73 %
Platelets: 132 K/uL — ABNORMAL LOW (ref 150–400)
RBC: 4.31 MIL/uL (ref 4.22–5.81)
RDW: 12.8 % (ref 11.5–15.5)
WBC: 8 K/uL (ref 4.0–10.5)
nRBC: 0 % (ref 0.0–0.2)

## 2024-08-08 LAB — COMPREHENSIVE METABOLIC PANEL WITH GFR
ALT: 20 U/L (ref 0–44)
AST: 25 U/L (ref 15–41)
Albumin: 4.2 g/dL (ref 3.5–5.0)
Alkaline Phosphatase: 85 U/L (ref 38–126)
Anion gap: 9 (ref 5–15)
BUN: 21 mg/dL (ref 8–23)
CO2: 29 mmol/L (ref 22–32)
Calcium: 9.4 mg/dL (ref 8.9–10.3)
Chloride: 99 mmol/L (ref 98–111)
Creatinine, Ser: 1 mg/dL (ref 0.61–1.24)
GFR, Estimated: >60 mL/min
Glucose, Bld: 112 mg/dL — ABNORMAL HIGH (ref 70–99)
Potassium: 4.2 mmol/L (ref 3.5–5.1)
Sodium: 138 mmol/L (ref 135–145)
Total Bilirubin: 0.4 mg/dL (ref 0.0–1.2)
Total Protein: 6.9 g/dL (ref 6.5–8.1)

## 2024-08-08 MED ORDER — ACETAMINOPHEN 500 MG PO TABS
1000.0000 mg | ORAL_TABLET | Freq: Once | ORAL | Status: AC
  Administered 2024-08-08: 1000 mg via ORAL
  Filled 2024-08-08: qty 2

## 2024-08-08 MED ORDER — TETRACAINE HCL 0.5 % OP SOLN
2.0000 [drp] | Freq: Once | OPHTHALMIC | Status: AC
  Administered 2024-08-08: 2 [drp] via OPHTHALMIC
  Filled 2024-08-08: qty 4

## 2024-08-08 NOTE — Progress Notes (Unsigned)
 "  NEUROLOGY CONSULTATION NOTE  Bishop Vanderwerf MRN: [id number] DOB: 1945/09/03  Referring provider: Lamar Shan, MD Primary care provider: Leni Edyth Fairly, MD  Reason for consult:  TIA  Assessment/Plan:   Transient ischemic attack Headache, unspecified - no structural intracranial abnormality or extracranial arterial abnormality on imaging.  Unclear if due to hypertension or if hypertension is secondary to headache.   Hypertensive urgency   Secondary stroke prevention: Discontinue Plavix .  Continue ASA 81mg  daily LDL goal less than 70.  Had labs performed at his PCP's office prior to TIA.  Will obtain labs.  Start statin as indicated Normotensive blood pressure (currently being optimized by PCP) Hgb A1c goal less than 7 Continue TIA workup: 2D echocardiogram 2 week Zio patch monitor Stated that we can start topiramate for headache prevention.  Patient defers for now and wants to see if headaches improve with optimal management of his blood pressure first. May treat headaches acutely with Tylenol . Limit use of pain relievers to no more than 9 days out of the month to prevent risk of rebound or medication-overuse headache.  Other option may be baclofen Follow up 5 months.  Total time spent on today's visit was 64 minutes dedicated to this patient today, preparing to see patient, examining the patient, ordering tests and/or medications and counseling the patient, documenting clinical information in the EHR or other health record, independently interpreting results and communicating results to the patient/family, discussing treatment and goals, answering patient's questions and coordinating care.    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  History supplemented by hospital notes and his accompanying  daughter  Discussed the use of AI scribe software for clinical note transcription with the patient, who gave verbal consent to proceed.  History of Present Illness Dustin Nielsen is an 79 year old male with prior TIA, ischemic stroke with residual left hand numbness, hypertension, hyperlipidemia, and nephrolithiasis, presenting for follow-up evaluation of new-onset recurrent exertional and persistent headaches with associated hypertension.  On 07/05/2024, he was performing sit ups at the gym when he developed a pressure-type headaches localized to the vertex, followed by an episode of expressive aphasia.  He was seen in the ED where systolic blood pressure was 170.  MRI brain 07/05/2024 moderate SVD and remote right thalamic lacunar infarct but no acute findings.  CTA head and neck showed no LVO or hemodynamically significant stenosis.  He was treated acutely for headache and hypertension.  Aphasia resolved in about an hour and headache lasted for several hours.  He was discharged on ASA and Plavix .  Headaches persisted since, fluctuating in intensity but never fully resolving, with exacerbations triggered by physical exertion or Valsalva maneuvers. Pain is described as pressure, typically mild but reaching moderate to severe intensity (up to 6/10). He denies prior history of headaches. Headaches are not associated with nausea, vomiting, photophobia, phonophobia, or visual changes, except for one episode of left eye pain during a severe headache. He denies neck pain. He has not experienced new right hand weakness, seizures, or confusion. Chronic left hand numbness and difficulty with fine motor tasks persist from prior stroke. No new speech disturbances have occurred since December. Tylenol  has been used during headache episodes, with observed improvement in headache severity and reduction in blood pressure. He frequently feels cold and prefers a warm environment. He remains physically active but notes  decreased exercise tolerance since [place] and is frustrated by limitations on his exercise routine due to these symptoms.  He monitors his blood pressure three times daily. Since December, blood pressure has remained persistently elevated, with average systolic readings at or above 849 mmHg, despite increased valsartan  dosing. During severe headache episodes, blood pressure has reached as high as 224/111 mmHg. Tylenol  has been effective in aborting headaches and reducing blood pressure. Hydralazine  has been used for severe hypertension as directed. No further TIAs have occurred since December.  On August 06, 2024, he developed a severe vertex headache (6/10) with blood pressure of 224/111 mmHg, without visual changes or speech disturbance. Tylenol  and hydralazine  administered in the ER resulted in improvement of both headache and blood pressure. On August 08, 2024, he developed a moderate headache (4-5/10) after sit-ups, with blood pressure rising from 158/84 to 181/83. Tylenol  500 mg was administered, resulting in improvement of headache and reduction in blood pressure to 147 mmHg. No further ER visits have occurred since then.     PAST MEDICAL HISTORY: Past Medical History:  Diagnosis Date   Allergy    Asthma    Chicken pox    Chronic kidney disease    stones   Hypertension    Stroke (HCC) 08-03-15   Left forearm and hand paresthesias    PAST SURGICAL HISTORY: Past Surgical History:  Procedure Laterality Date   CHOLECYSTECTOMY      MEDICATIONS: Medications Ordered Prior to Encounter[1]  ALLERGIES: Allergies[2]  FAMILY HISTORY: Family History  Problem Relation Age of Onset   Kidney disease Mother 58       deceased   Stroke Father    Hypertension Father    Heart disease Father 70       MI    Objective:  Blood pressure (!) 143/74, pulse 65, height 5' 9 (1.753 m), weight 153 lb 12.8 oz (69.8 kg), SpO2 97%. General: No acute distress.  Patient appears  well-groomed.   Head:  Normocephalic/atraumatic Eyes:  fundi examined but not visualized Neck: supple, no paraspinal tenderness, full range of motion Heart: regular rate and rhythm Neurological Exam: Mental status: alert and oriented to person, place, and time, speech fluent and not dysarthric, language intact. Cranial nerves: CN I: not tested CN II: pupils equal, round and reactive to light, visual fields intact CN III, IV, VI:  full range of motion, no nystagmus, no ptosis CN V: facial sensation intact. CN VII: upper and lower face symmetric CN VIII: hearing intact CN IX, X: gag intact, uvula midline CN XI: sternocleidomastoid and trapezius muscles intact CN XII: tongue midline Bulk & Tone: normal, no fasciculations. Motor:  muscle strength 5/5 throughout Sensation:  Pinprick and vibratory sensation intact. Deep Tendon Reflexes:  2+ throughout,  toes downgoing.   Finger to nose testing:  Without dysmetria.   Gait:  Normal station and stride.  Romberg negative.    Juliene Dunnings, DO  CC: Leni Edyth Fairly, MD        [1]  Current Outpatient Medications on File Prior to Visit  Medication Sig Dispense Refill   amLODipine  (NORVASC ) 5 MG tablet Take 1 tablet (5 mg total) by mouth daily. 90 tablet 3   aspirin  81 MG chewable tablet Chew 1 tablet (81 mg total) by mouth daily. 30 tablet 2   clopidogrel  (PLAVIX ) 75 MG tablet Take 1 tablet (75 mg total) by mouth daily. 30 tablet 2   hydrALAZINE  (APRESOLINE ) 25 MG tablet Take 1 tablet (25  mg total) by mouth 2 (two) times daily as needed (Systolic blood pressure > 180). 30 tablet 0   valsartan -hydrochlorothiazide  (DIOVAN -HCT) 160-25 MG tablet Take 1 tablet by mouth daily. 90 tablet 3   valsartan -hydrochlorothiazide  (DIOVAN -HCT) 160-25 MG tablet TAKE 1 TABLET BY MOUTH DAILY 90 tablet 0   [DISCONTINUED] montelukast  (SINGULAIR ) 10 MG tablet Take 1 tablet (10 mg total) by mouth at bedtime. 30 tablet 11   No current facility-administered  medications on file prior to visit.  [2] No Known Allergies  "

## 2024-08-08 NOTE — ED Triage Notes (Addendum)
 Pt complaining of high blood pressure since christmas. Has been to the er 2 times before for same.

## 2024-08-08 NOTE — Discharge Instructions (Signed)
 As we discussed, please return to the ED with any new symptoms or concerns. Continue efforts for outpatient follow up with your primary care provider and with neurology.

## 2024-08-08 NOTE — ED Provider Notes (Signed)
 " [facility] AT [facility] Provider Note   CSN: [id number] Arrival date & time: 08/08/24  0110     Patient presents with: Hypertension   Paige Vanderwoude is a 79 y.o. male.   Patient returns to the ED with recurrent blood pressure elevation and headache. His daughter reports the headache occurs when his blood pressure increases and tonight included discomfort from the crown, where it is typical, to the left eye. He has history of CVA, TIA that affected the left eye and face having residual very mild numbness, but no eye pain. He denies visual change. No speech or balance difficulty tonight. No chest pain, sOB, nausea, vomiting. Seen in ED yesterday for same and blood pressure was elevated. He was discharged with additional blood pressure medication of hydralazine  25 mg to be used prn when BP >180 systolic. Daughter gave hydralazine  at 11:20 and decided to return to the ED when pressure was still >200 and headache persisted.   The history is provided by the patient and a relative. No language interpreter was used.  Hypertension       Prior to Admission medications  Medication Sig Start Date End Date Taking? Authorizing Provider  amLODipine  (NORVASC ) 5 MG tablet Take 1 tablet (5 mg total) by mouth daily. 11/06/15   Burchette, Wolm ORN, MD  aspirin  81 MG chewable tablet Chew 1 tablet (81 mg total) by mouth daily. 07/05/24   Yolande Lamar BROCKS, MD  clopidogrel  (PLAVIX ) 75 MG tablet Take 1 tablet (75 mg total) by mouth daily. 07/05/24   Yolande Lamar BROCKS, MD  hydrALAZINE  (APRESOLINE ) 25 MG tablet Take 1 tablet (25 mg total) by mouth 2 (two) times daily as needed (Systolic blood pressure > 180). 08/06/24   Charlyn Sora, MD  valsartan -hydrochlorothiazide  (DIOVAN -HCT) 160-25 MG tablet Take 1 tablet by mouth daily. 11/12/16   Burchette, Wolm ORN, MD  valsartan -hydrochlorothiazide  (DIOVAN -HCT) 160-25 MG tablet TAKE 1 TABLET BY MOUTH DAILY 08/26/17   Burchette, Wolm ORN, MD   montelukast  (SINGULAIR ) 10 MG tablet Take 1 tablet (10 mg total) by mouth at bedtime. 12/06/10 10/02/11  Burchette, Wolm ORN, MD    Allergies: Patient has no known allergies.    Review of Systems  Updated Vital Signs BP (!) 190/58   Pulse 70   Temp 98.4 F (36.9 C)   Resp 16   Ht 5' 9 (1.753 m)   Wt 67.1 kg   SpO2 100%   BMI 21.86 kg/m   Physical Exam Constitutional:      Appearance: He is well-developed.  HENT:     Head: Normocephalic.  Cardiovascular:     Rate and Rhythm: Normal rate and regular rhythm.     Heart sounds: No murmur heard. Pulmonary:     Effort: Pulmonary effort is normal.     Breath sounds: Normal breath sounds. No wheezing, rhonchi or rales.  Abdominal:     General: Bowel sounds are normal.     Palpations: Abdomen is soft.     Tenderness: There is no abdominal tenderness. There is no guarding or rebound.  Musculoskeletal:        General: Normal range of motion.     Cervical back: Normal range of motion and neck supple.  Skin:    General: Skin is warm and dry.  Neurological:     General: No focal deficit present.     Mental Status: He is alert and oriented to person, place, and time.     (all labs ordered  are listed, but only abnormal results are displayed) Labs Reviewed  COMPREHENSIVE METABOLIC PANEL WITH GFR - Abnormal; Notable for the following components:      Result Value   Glucose, Bld 112 (*)    All other components within normal limits  CBC WITH DIFFERENTIAL/PLATELET - Abnormal; Notable for the following components:   Platelets 132 (*)    All other components within normal limits    EKG: None  Radiology: CT Head Wo Contrast Result Date: 08/06/2024 EXAM: CT HEAD WITHOUT CONTRAST 08/06/2024 04:28:49 PM TECHNIQUE: CT of the head was performed without the administration of intravenous contrast. Automated exposure control, iterative reconstruction, and/or weight based adjustment of the mA/kV was utilized to reduce the radiation dose to  as low as reasonably achievable. COMPARISON: MRI of the head without contrast 07/05/2024 and a CT of the head without contrast 07/05/2024. CLINICAL HISTORY: headache, hypertension FINDINGS: BRAIN AND VENTRICLES: No acute hemorrhage. No evidence of acute infarct. Periventricular and scattered subcortical white matter hypoattenuation bilaterally is stable, mildly advanced for age. Chronic right thalamic lacunar infarction is stable. Atherosclerotic calcifications within the cavernous internal carotid arteries. No hydrocephalus. No extra-axial collection. No mass effect or midline shift. ORBITS: No acute abnormality. SINUSES: No acute abnormality. SOFT TISSUES AND SKULL: No acute soft tissue abnormality. No skull fracture. IMPRESSION: 1. No acute intracranial abnormality. Electronically signed by: Lonni Necessary MD 08/06/2024 04:56 PM EST RP Workstation: [id number]     Procedures   Medications Ordered in the ED  acetaminophen  (TYLENOL ) tablet 1,000 mg (has no administration in time range)  tetracaine  (PONTOCAINE) 0.5 % ophthalmic solution 2 drop (2 drops Left Eye Given 08/08/24 0243)    Clinical Course as of 08/08/24 0413  Mon Aug 08, 2024  0406 Patient returns for elevated blood pressure and headache. No ss/sxs of acute stroke or TIA. Blood pressure trending down without intervention in the ED. Headache better. Will provide tylenol  to improve. He has been seen by dr. Raford, ED attending.   No further management felt indicated. Family is working with PCP and neurology for blood pressure evaluation/control. Discussed adding Norvasc  to his regimen. Daughter confirms he is only taking Valsartan  160 mg for pressure. Patient and family prefer outpatient follow up over starting something new.   He is felt appropriate for discharge home. Patient and family are encouraged to continue planned f/u and to return to the ED with concerning symptoms at any time.  [SU]    Clinical Course User Index [SU]  Odell Balls, PA-C                                 Medical Decision Making Amount and/or Complexity of Data Reviewed Labs: ordered.  Risk Prescription drug management.        Final diagnoses:  Hypertension, unspecified type    ED Discharge Orders     None          Odell Balls, PA-C 08/08/24 0413    Raford Lenis, MD 08/08/24 [phone]  "

## 2024-08-09 ENCOUNTER — Ambulatory Visit: Payer: Self-pay | Admitting: Neurology

## 2024-08-09 ENCOUNTER — Encounter: Payer: Self-pay | Admitting: Neurology

## 2024-08-09 ENCOUNTER — Ambulatory Visit: Attending: Neurology

## 2024-08-09 VITALS — BP 143/74 | HR 65 | Ht 69.0 in | Wt 153.8 lb

## 2024-08-09 DIAGNOSIS — G459 Transient cerebral ischemic attack, unspecified: Secondary | ICD-10-CM

## 2024-08-09 DIAGNOSIS — R519 Headache, unspecified: Secondary | ICD-10-CM

## 2024-08-09 DIAGNOSIS — I160 Hypertensive urgency: Secondary | ICD-10-CM

## 2024-08-09 NOTE — Progress Notes (Unsigned)
Enrolled for [employer] to mail a ZIO AT Live Telemetry monitor to patients address on file.   DOD to read.

## 2024-08-09 NOTE — Patient Instructions (Signed)
 Discontinue clopidogrel  (Plavix ).  Continue aspirin  81mg  daily Will obtain lipid panel results from Dr. Maree and let you know if we need to start a statin Check echocardiogram and 2 week Zio patch monitor Take Tylenol  as needed for headache but LIMIT TO NO MORE THAN 9 DAYS OUT OF THE MONTH TO PREVENT REBOUND HEADACHE. If needed, and headaches do not improve, contact me and we can start TOPIRAMATE for headache prevention Keep headache diary Follow up 5 months.

## 2024-08-10 NOTE — Progress Notes (Unsigned)
 " Cardiology Office Note:    Date:  08/11/2024   ID:  Dustin Nielsen, DOB 1945-09-18, MRN [id number]  PCP:  Maree Leni Edyth DELENA, MD  Cardiologist:  None  Electrophysiologist:  None   Referring MD: Skeet Juliene SAUNDERS, DO   Chief Complaint  Patient presents with   Cerebrovascular Accident    History of Present Illness:    Dustin Nielsen is a 79 y.o. male with a hx of hypertension, hyperlipidemia, CVA who is referred by Dr. Skeet for evaluation of a CVA.  First CVA occurred in 2017.  Then had TIA in 2022.  He was seen for suspected TIA 07/05/2024, developed expressive aphasia.  Aphasia resolved in about an hour and he was discharged on aspirin  and Plavix .  Saw Dr. Skeet in neurology, and echocardiogram and cardiac monitor were ordered.  Denies any chest pain, dyspnea, lightheadedness, syncope, lower extremity edema, or palpitations.  Exercises regularly.     Past Medical History:  Diagnosis Date   Allergy    Asthma    Chicken pox    Chronic kidney disease    stones   Hypertension    Stroke [facility]) 08-03-15   Left forearm and hand paresthesias    Past Surgical History:  Procedure Laterality Date   CHOLECYSTECTOMY      Current Medications: Active Medications[1]   Allergies:   Patient has no known allergies.   Social History   Socioeconomic History   Marital status: Married    Spouse name: Not on file   Number of children: Not on file   Years of education: Not on file   Highest education level: Not on file  Occupational History   Not on file  Tobacco Use   Smoking status: Former    Current packs/day: 0.00    Average packs/day: 1 pack/day for 15.0 years (15.0 ttl pk-yrs)    Types: Cigarettes    Start date: 12/06/1958    Quit date: 12/05/1973    Years since quitting: 50.7   Smokeless tobacco: Never  Substance and Sexual Activity   Alcohol use: Never   Drug use: Never   Sexual activity: Not on file  Other Topics Concern   Not on file  Social History Narrative   Right  handed   Social Drivers of Health   Tobacco Use: Medium Risk (08/11/2024)   Patient History    Smoking Tobacco Use: Former    Smokeless Tobacco Use: Never    Passive Exposure: Not on Actuary Strain: Not on file  Food Insecurity: No Food Insecurity (07/05/2024)   Epic    Worried About Programme Researcher, Broadcasting/film/video in the Last Year: Never true    Ran Out of Food in the Last Year: Never true  Transportation Needs: No Transportation Needs (07/05/2024)   Epic    Lack of Transportation (Medical): No    Lack of Transportation (Non-Medical): No  Physical Activity: Not on file  Stress: Not on file  Social Connections: Moderately Integrated (07/05/2024)   Social Connection and Isolation Panel    Frequency of Communication with Friends and Family: More than three times a week    Frequency of Social Gatherings with Friends and Family: More than three times a week    Attends Religious Services: More than 4 times per year    Active Member of [employer] or Organizations: No    Attends Banker Meetings: Never    Marital Status: Married  Depression (PHQ2-9): Not on file  Alcohol Screen:  Not on file  Housing: Low Risk (07/05/2024)   Epic    Unable to Pay for Housing in the Last Year: No    Number of Times Moved in the Last Year: 0    Homeless in the Last Year: No  Utilities: Not At Risk (07/05/2024)   Epic    Threatened with loss of utilities: No  Health Literacy: Not on file     Family History: The patient's family history includes Heart disease (age of onset: 65) in his father; Hypertension in his father; Kidney disease (age of onset: 67) in his mother; Stroke in his father.  ROS:   Please see the history of present illness.     All other systems reviewed and are negative.  EKGs/Labs/Other Studies Reviewed:    The following studies were reviewed today:   EKG:  EKG is not ordered today.   Recent Labs: 08/08/2024: ALT 20; BUN 21; Creatinine, Ser 1.00; Hemoglobin  13.8; Platelets 132; Potassium 4.2; Sodium 138  Recent Lipid Panel    Component Value Date/Time   CHOL 183 11/12/2016 0847   TRIG 119.0 11/12/2016 0847   HDL 58.10 11/12/2016 0847   CHOLHDL 3 11/12/2016 0847   VLDL 23.8 11/12/2016 0847   LDLCALC 101 (H) 11/12/2016 0847   LDLDIRECT 102.6 04/13/2013 1002    Physical Exam:    VS:  BP (!) 150/76   Pulse 75   Ht 5' 9 (1.753 m)   Wt 149 lb 3.2 oz (67.7 kg)   SpO2 98%   BMI 22.03 kg/m     Wt Readings from Last 3 Encounters:  08/11/24 149 lb 3.2 oz (67.7 kg)  08/09/24 153 lb 12.8 oz (69.8 kg)  08/08/24 148 lb (67.1 kg)     GEN:  Well nourished, well developed in no acute distress HEENT: Normal NECK: No JVD; No carotid bruits LYMPHATICS: No lymphadenopathy CARDIAC: RRR, no murmurs, rubs, gallops RESPIRATORY:  Clear to auscultation without rales, wheezing or rhonchi  ABDOMEN: Soft, non-tender, non-distended MUSCULOSKELETAL:  No edema; No deformity  SKIN: Warm and dry NEUROLOGIC:  Alert and oriented x 3 PSYCHIATRIC:  Normal affect   ASSESSMENT:    1. Cerebrovascular accident (CVA), unspecified mechanism (HCC)   2. TIA (transient ischemic attack)   3. Essential hypertension   4. Hyperlipidemia, unspecified hyperlipidemia type    PLAN:    CVA: History of prior CVA in 2017 and TIA in 2022 and presented with TIA 06/2024.   - Follow-up Zio patch x 2 weeks - Will follow-up echocardiogram, will add bubble study - Continue aspirin  81 mg daily - Check lipid panel and plan to start statin  Hypertension: On valsartan  160 mg daily.  BP uncontrolled on home log and elevated in clinic today, will add amlodipine  5 mg daily.  Asked to check BP daily for next 2 weeks and let us  know results  RTC in 3 months  Medication Adjustments/Labs and Tests Ordered: Current medicines are reviewed at length with the patient today.  Concerns regarding medicines are outlined above.  Orders Placed This Encounter  Procedures   Comprehensive  Metabolic Panel (CMET)   Lipid panel   TSH   ECHOCARDIOGRAM COMPLETE BUBBLE STUDY   Meds ordered this encounter  Medications   amLODipine  (NORVASC ) 5 MG tablet    Sig: Take 1 tablet (5 mg total) by mouth daily.    Dispense:  90 tablet    Refill:  3    Patient Instructions  Medication Instructions:  Start Amlodipine  5mg   daily Your physician recommends that you continue on your current medications as directed. Please refer to the Current Medication list given to you today.  *If you need a refill on your cardiac medications before your next appointment, please call your pharmacy*  Lab Work: Tsh, lipid, cmet If you have labs (blood work) drawn today and your tests are completely normal, you will receive your results only by: MyChart Message (if you have MyChart) OR A paper copy in the mail If you have any lab test that is abnormal or we need to change your treatment, we will call you to review the results.  Testing/Procedures: Echo  Your physician has requested that you have an echocardiogram. Echocardiography is a painless test that uses sound waves to create images of your heart. It provides your doctor with information about the size and shape of your heart and how well your hearts chambers and valves are working. This procedure takes approximately one hour. There are no restrictions for this procedure. Please do NOT wear cologne, perfume, aftershave, or lotions (deodorant is allowed). Please arrive 15 minutes prior to your appointment time.  Please note: We ask at that you not bring children with you during ultrasound (echo/ vascular) testing. Due to room size and safety concerns, children are not allowed in the ultrasound rooms during exams. Our front office staff cannot provide observation of children in our lobby area while testing is being conducted. An adult accompanying a patient to their appointment will only be allowed in the ultrasound room at the discretion of the  ultrasound technician under special circumstances. We apologize for any inconvenience.   Follow-Up: At [facility], you and your health needs are our priority.  As part of our continuing mission to provide you with exceptional heart care, our providers are all part of one team.  This team includes your primary Cardiologist (physician) and Advanced Practice Providers or APPs (Physician Assistants and Nurse Practitioners) who all work together to provide you with the care you need, when you need it.  Your next appointment:   3 months  Provider:    Dr. Kate  We recommend signing up for the patient portal called MyChart.  Sign up information is provided on this After Visit Summary.  MyChart is used to connect with patients for Virtual Visits (Telemedicine).  Patients are able to view lab/test results, encounter notes, upcoming appointments, etc.  Non-urgent messages can be sent to your provider as well.   To learn more about what you can do with MyChart, go to [email or web address].   Other Instructions Please check blood pressure daily for next 2 weeks and send those reading            Signed, Lonni LITTIE Kate, MD  08/11/2024 4:08 PM    [facility]     [1]  Current Meds  Medication Sig   amLODipine  (NORVASC ) 5 MG tablet Take 1 tablet (5 mg total) by mouth daily.   aspirin  81 MG chewable tablet Chew 1 tablet (81 mg total) by mouth daily.   valsartan  (DIOVAN ) 160 MG tablet Take 1 tablet (160 mg total) by mouth daily.   [DISCONTINUED] hydrALAZINE  (APRESOLINE ) 25 MG tablet Take 1 tablet (25 mg total) by mouth 2 (two) times daily as needed (Systolic blood pressure > 180).   [DISCONTINUED] valsartan -hydrochlorothiazide  (DIOVAN -HCT) 160-25 MG tablet Take 1 tablet by mouth daily.   [DISCONTINUED] valsartan -hydrochlorothiazide  (DIOVAN -HCT) 160-25 MG tablet TAKE 1 TABLET BY MOUTH DAILY   "

## 2024-08-11 ENCOUNTER — Ambulatory Visit: Attending: Cardiology | Admitting: Cardiology

## 2024-08-11 ENCOUNTER — Encounter: Payer: Self-pay | Admitting: Cardiology

## 2024-08-11 VITALS — BP 150/76 | HR 75 | Ht 69.0 in | Wt 149.2 lb

## 2024-08-11 DIAGNOSIS — I10 Essential (primary) hypertension: Secondary | ICD-10-CM | POA: Diagnosis not present

## 2024-08-11 DIAGNOSIS — I639 Cerebral infarction, unspecified: Secondary | ICD-10-CM

## 2024-08-11 DIAGNOSIS — E785 Hyperlipidemia, unspecified: Secondary | ICD-10-CM

## 2024-08-11 DIAGNOSIS — G459 Transient cerebral ischemic attack, unspecified: Secondary | ICD-10-CM | POA: Diagnosis not present

## 2024-08-11 MED ORDER — AMLODIPINE BESYLATE 5 MG PO TABS: 5.0000 mg | ORAL_TABLET | Freq: Every day | ORAL | 3 refills | Status: AC

## 2024-08-11 NOTE — Patient Instructions (Signed)
 Medication Instructions:  Start Amlodipine  5mg  daily Your physician recommends that you continue on your current medications as directed. Please refer to the Current Medication list given to you today.  *If you need a refill on your cardiac medications before your next appointment, please call your pharmacy*  Lab Work: Tsh, lipid, cmet If you have labs (blood work) drawn today and your tests are completely normal, you will receive your results only by: MyChart Message (if you have MyChart) OR A paper copy in the mail If you have any lab test that is abnormal or we need to change your treatment, we will call you to review the results.  Testing/Procedures: Echo  Your physician has requested that you have an echocardiogram. Echocardiography is a painless test that uses sound waves to create images of your heart. It provides your doctor with information about the size and shape of your heart and how well your hearts chambers and valves are working. This procedure takes approximately one hour. There are no restrictions for this procedure. Please do NOT wear cologne, perfume, aftershave, or lotions (deodorant is allowed). Please arrive 15 minutes prior to your appointment time.  Please note: We ask at that you not bring children with you during ultrasound (echo/ vascular) testing. Due to room size and safety concerns, children are not allowed in the ultrasound rooms during exams. Our front office staff cannot provide observation of children in our lobby area while testing is being conducted. An adult accompanying a patient to their appointment will only be allowed in the ultrasound room at the discretion of the ultrasound technician under special circumstances. We apologize for any inconvenience.   Follow-Up: At [facility], you and your health needs are our priority.  As part of our continuing mission to provide you with exceptional heart care, our providers are all part of one team.   This team includes your primary Cardiologist (physician) and Advanced Practice Providers or APPs (Physician Assistants and Nurse Practitioners) who all work together to provide you with the care you need, when you need it.  Your next appointment:   3 months  Provider:    Dr. Kate  We recommend signing up for the patient portal called MyChart.  Sign up information is provided on this After Visit Summary.  MyChart is used to connect with patients for Virtual Visits (Telemedicine).  Patients are able to view lab/test results, encounter notes, upcoming appointments, etc.  Non-urgent messages can be sent to your provider as well.   To learn more about what you can do with MyChart, go to [email or web address].   Other Instructions Please check blood pressure daily for next 2 weeks and send those reading

## 2024-08-12 LAB — COMPREHENSIVE METABOLIC PANEL WITH GFR
ALT: 18 IU/L (ref 0–44)
AST: 22 IU/L (ref 0–40)
Albumin: 4.3 g/dL (ref 3.8–4.8)
Alkaline Phosphatase: 90 IU/L (ref 47–123)
BUN/Creatinine Ratio: 16 (ref 10–24)
BUN: 17 mg/dL (ref 8–27)
Bilirubin Total: 0.5 mg/dL (ref 0.0–1.2)
CO2: 26 mmol/L (ref 20–29)
Calcium: 9.2 mg/dL (ref 8.6–10.2)
Chloride: 99 mmol/L (ref 96–106)
Creatinine, Ser: 1.05 mg/dL (ref 0.76–1.27)
Globulin, Total: 2.3 g/dL (ref 1.5–4.5)
Glucose: 111 mg/dL — ABNORMAL HIGH (ref 70–99)
Potassium: 4.2 mmol/L (ref 3.5–5.2)
Sodium: 138 mmol/L (ref 134–144)
Total Protein: 6.6 g/dL (ref 6.0–8.5)
eGFR: 72 mL/min/1.73

## 2024-08-12 LAB — LIPID PANEL
Chol/HDL Ratio: 2.4 ratio (ref 0.0–5.0)
Cholesterol, Total: 147 mg/dL (ref 100–199)
HDL: 61 mg/dL
LDL Chol Calc (NIH): 67 mg/dL (ref 0–99)
Triglycerides: 106 mg/dL (ref 0–149)
VLDL Cholesterol Cal: 19 mg/dL (ref 5–40)

## 2024-08-12 LAB — TSH: TSH: 0.607 u[IU]/mL (ref 0.450–4.500)

## 2024-08-14 ENCOUNTER — Ambulatory Visit: Payer: Self-pay | Admitting: Cardiology

## 2024-08-14 DIAGNOSIS — Z79899 Other long term (current) drug therapy: Secondary | ICD-10-CM

## 2024-08-14 DIAGNOSIS — E785 Hyperlipidemia, unspecified: Secondary | ICD-10-CM

## 2024-08-23 MED ORDER — ATORVASTATIN CALCIUM 10 MG PO TABS: 10.0000 mg | ORAL_TABLET | Freq: Every day | ORAL | 3 refills | Status: AC

## 2024-09-14 ENCOUNTER — Ambulatory Visit (HOSPITAL_COMMUNITY)

## 2024-11-17 ENCOUNTER — Ambulatory Visit: Admitting: Cardiology

## 2025-02-17 ENCOUNTER — Ambulatory Visit: Payer: Self-pay | Admitting: Neurology
